# Patient Record
Sex: Male | Born: 1965 | Race: Black or African American | Hispanic: No | Marital: Single | Smoking: Current every day smoker
Health system: Southern US, Community
[De-identification: ages and names within clinical notes are randomized; demographics above are authoritative.]

## PROBLEM LIST (undated history)

## (undated) DIAGNOSIS — R7303 Prediabetes: Secondary | ICD-10-CM

## (undated) DIAGNOSIS — E782 Mixed hyperlipidemia: Secondary | ICD-10-CM

## (undated) DIAGNOSIS — K219 Gastro-esophageal reflux disease without esophagitis: Secondary | ICD-10-CM

## (undated) HISTORY — DX: Prediabetes: R73.03

---

## 2007-01-02 ENCOUNTER — Emergency Department (HOSPITAL_COMMUNITY): Admission: EM | Admit: 2007-01-02 | Discharge: 2007-01-02 | Payer: Self-pay | Admitting: Emergency Medicine

## 2010-07-29 ENCOUNTER — Emergency Department (HOSPITAL_COMMUNITY)
Admission: EM | Admit: 2010-07-29 | Discharge: 2010-07-29 | Disposition: A | Discharge status: Home / Self Care | Payer: Self-pay | Attending: Emergency Medicine | Admitting: Emergency Medicine

## 2010-07-29 DIAGNOSIS — T63461A Toxic effect of venom of wasps, accidental (unintentional), initial encounter: Secondary | ICD-10-CM | POA: Insufficient documentation

## 2010-07-29 DIAGNOSIS — M7989 Other specified soft tissue disorders: Secondary | ICD-10-CM | POA: Insufficient documentation

## 2010-07-29 DIAGNOSIS — T6391XA Toxic effect of contact with unspecified venomous animal, accidental (unintentional), initial encounter: Secondary | ICD-10-CM | POA: Insufficient documentation

## 2010-07-29 DIAGNOSIS — L989 Disorder of the skin and subcutaneous tissue, unspecified: Secondary | ICD-10-CM | POA: Insufficient documentation

## 2010-11-28 LAB — CBC
HCT: 40.1
Hemoglobin: 14.2
MCV: 95.4
Platelets: 402 — ABNORMAL HIGH
RDW: 13.8

## 2010-11-28 LAB — DIFFERENTIAL
Basophils Absolute: 0.2 — ABNORMAL HIGH
Basophils Relative: 2 — ABNORMAL HIGH
Eosinophils Absolute: 0.3

## 2010-11-28 LAB — BASIC METABOLIC PANEL
CO2: 24
Calcium: 9.4
Chloride: 105
Creatinine, Ser: 0.78
GFR calc Af Amer: >60
Sodium: 136

## 2012-09-24 ENCOUNTER — Emergency Department (HOSPITAL_COMMUNITY)
Admission: EM | Admit: 2012-09-24 | Discharge: 2012-09-24 | Disposition: A | Discharge status: Home / Self Care | Payer: Self-pay | Attending: Emergency Medicine | Admitting: Emergency Medicine

## 2012-09-24 ENCOUNTER — Encounter (HOSPITAL_COMMUNITY): Payer: Self-pay | Admitting: Emergency Medicine

## 2012-09-24 DIAGNOSIS — F17200 Nicotine dependence, unspecified, uncomplicated: Secondary | ICD-10-CM | POA: Insufficient documentation

## 2012-09-24 DIAGNOSIS — Y929 Unspecified place or not applicable: Secondary | ICD-10-CM | POA: Insufficient documentation

## 2012-09-24 DIAGNOSIS — T1590XA Foreign body on external eye, part unspecified, unspecified eye, initial encounter: Secondary | ICD-10-CM | POA: Insufficient documentation

## 2012-09-24 DIAGNOSIS — Y9389 Activity, other specified: Secondary | ICD-10-CM | POA: Insufficient documentation

## 2012-09-24 DIAGNOSIS — S058X9A Other injuries of unspecified eye and orbit, initial encounter: Secondary | ICD-10-CM | POA: Insufficient documentation

## 2012-09-24 DIAGNOSIS — S0502XA Injury of conjunctiva and corneal abrasion without foreign body, left eye, initial encounter: Secondary | ICD-10-CM

## 2012-09-24 MED ORDER — FLUORESCEIN SODIUM 1 MG OP STRP
1.0000 | ORAL_STRIP | Freq: Once | OPHTHALMIC | Status: DC
  Filled 2012-09-24: qty 1

## 2012-09-24 MED ORDER — ERYTHROMYCIN 5 MG/GM OP OINT: TOPICAL_OINTMENT | OPHTHALMIC | Status: DC

## 2012-09-24 MED ORDER — TETRACAINE HCL 0.5 % OP SOLN
2.0000 [drp] | Freq: Once | OPHTHALMIC | Status: DC
  Filled 2012-09-24: qty 2

## 2012-09-24 NOTE — ED Notes (Signed)
Pt reports left sided eye pain that started yesterday around 1500. Pt reports cutting wood with a skill saw and it is possible some dust flew in his eye. Pt reports washing out his eye with a over the counter eye wash solution. Eye visualized in triage. No obvious object identified about inspection, however the sclera is red and irritated. Pt is A/O x4 and in no apparent distress.

## 2012-09-24 NOTE — ED Provider Notes (Signed)
CSN: [id number]     Arrival date & time 09/24/12  1522 History    This chart was scribed for Raymon Mutton, PA working with Flint Melter, MD by Quintella Reichert, ED Scribe. This patient was seen in room WTR5/WTR5 and the patient's care was started at 7:05 PM.     Chief Complaint  Patient presents with  . Eye Pain    The history is provided by the patient. No language interpreter was used.    HPI Comments: Ryan Owen is a 47 y.o. male who presents to the Emergency Department complaining of constant moderate left eye pain that began yesterday afternoon, with associated redness, clear discharge, photophobia, and a small amount of visible swelling to the eyelids.  Pt reports he was cutting wood with a saw and sawdust may have entered the eye.  He was not wearing eye protection.  He notes that he cleaned his eye out with water after the incident.   Pain is described as "irritating" with a foreign body sensation.  It is aggravated when sweat runs into the eye and is not relieved by anything.  It is not aggravated by eye movements.  It does not radiate to any other areas.  He has attempted to treat pain with an OTC eye wash solution, without relief.  He denies loss of vision, fever, chills, CP, SOB, difficulty swallowing, sore throat, facial pain, ear pain, headache, dizziness or any other associated symptoms.  He denies h/o eye problems.  Last tetanus vaccination was 1.5 years ago.   Pt has no PCP.   History reviewed. No pertinent past medical history.   History reviewed. No pertinent past surgical history.   No family history on file.   History  Substance Use Topics  . Smoking status: Current Every Day Smoker -- 0.50 packs/day for 20 years    Types: Cigarettes  . Smokeless tobacco: Never Used  . Alcohol Use: 10.8 oz/week    18 Cans of beer per week     Review of Systems  Constitutional: Negative for fever and chills.  HENT: Negative for ear pain, sore throat and trouble  swallowing.   Eyes: Positive for photophobia, pain, discharge and redness. Negative for itching and visual disturbance.       Swelling to eyelids, foreign body sensation.  Respiratory: Negative for shortness of breath.   Cardiovascular: Negative for chest pain.  Neurological: Negative for dizziness and headaches.  All other systems reviewed and are negative.     Allergies  Review of patient's allergies indicates no known allergies.  Home Medications   Current Outpatient Rx  Name  Route  Sig  Dispense  Refill  . erythromycin ophthalmic ointment      Place a 1/2 inch ribbon of ointment into the lower eyelid of the left eye 4 times a day for 5 days   3.5 g   0     BP 135/92  Pulse 84  Temp(Src) 97.9 F (36.6 C) (Oral)  Resp 16  SpO2 100%  Physical Exam  Nursing note and vitals reviewed. Constitutional: He is oriented to person, place, and time. He appears well-developed and well-nourished. No distress.  HENT:  Head: Normocephalic and atraumatic.  Eyes: EOM and lids are normal. Pupils are equal, round, and reactive to light. No foreign bodies found. Right eye exhibits no discharge. Left eye exhibits no discharge. Left conjunctiva is injected. Left conjunctiva has no hemorrhage. Left eye exhibits normal extraocular motion and no nystagmus. Left pupil is round  and reactive.  Fundoscopic exam:      The right eye shows no arteriolar narrowing, no AV nicking, no exudate, no hemorrhage and no papilledema. The right eye shows red reflex and venous pulsations.       The left eye shows no arteriolar narrowing, no AV nicking, no exudate, no hemorrhage and no papilledema. The left eye shows red reflex and venous pulsations.  Slit lamp exam:      The left eye shows corneal abrasion and fluorescein uptake. The left eye shows no foreign body, no hyphema and no anterior chamber bulge.    Mild swelling noted to the lower lid of the left eye - negative erythema, inflammation Tearing  positive to the left eye - clear  Negative discharge noted Injection to the left sclera noted  Neck: Normal range of motion. Neck supple.  Cardiovascular: Normal rate, regular rhythm and normal heart sounds.   No murmur heard. Pulses:      Radial pulses are 2+ on the right side, and 2+ on the left side.  Pulmonary/Chest: Effort normal and breath sounds normal. No respiratory distress. He has no wheezes. He has no rales.  Musculoskeletal: Normal range of motion.  Neurological: He is alert and oriented to person, place, and time. No cranial nerve deficit.  Cranial nerves III-XII grossly intact   Skin: Skin is warm and dry. No erythema.  Psychiatric: He has a normal mood and affect. His behavior is normal.    ED Course  Procedures (including critical care time)  DIAGNOSTIC STUDIES: Oxygen Saturation is 100% on room air, normal by my interpretation.    COORDINATION OF CARE: 7:12 PM-Discussed treatment plan which includesslit-lamp exam with pt at bedside and pt agreed to plan.    SLIT LAMP EXAM: Tetracaine 2 drop used.  Lids everted and swept for exam, no evidence of foreign body.  Conjunctivae: Some injection Cornea: Abrasions noted. EOM: Intact  Pupils: PERRL  Fluorescein uptake: Yes  Negative dendritic lesion. Patient tolerated procedure well without immediate complications.   7:25 PM: Informed pt that examination revealed small corneal abrasions.  Discussed treatment plan which includes erythromycin eye drops and f/u with ophthalmologist tomorrow with pt at bedside and pt agreed to plan.    Labs Reviewed - No data to display  No results found.  1. Corneal abrasion, left, initial encounter      MDM  I personally performed the services described in this documentation, which was scribed in my presence. The recorded information has been reviewed and is accurate.  Patient presenting to the ED with left eye pain that started yesterday after sawing and saw dust got into  patient's eye - denied wearing glasses. Presenting to the ED with mild swelling to the left eye with injection and positive clear tearing. Reported that the left eye is irritated, has a foreign body sensation. Denied trauma, injury, loss of vision, pus drainage, fever, chills, pain with motion. Mild swelling noted to the lower lid of the left eye - minimal - negative erythema, inflammation. Injection to the left sclera noted. Lid everted with negative foreign body. Negative fundoscopic findings. Slit lamp and positive fluorescein uptake - corneal abrasion noted. Negative dendritic lesion. Negative Seidel's sign. Visual acuity negative findings - left 20/25, right 20/25.  Negative signs of pre-septal and post-septal cellulitis. Negative signs of herpes simplex keratitis. Corneal abrasion noted. Patient stable, afebrile. Up to date on tetanus. Discharged patient with antibiotics. Referred patient to ophthalmology. Recommended patient to wear sunglasses and protective eye  gear. Discussed with patient to continue to monitor symptoms and if symptoms are to worsen or change to report back to the ED - strict return instructions given.  Patient agreed to plan of care, understood, all questions answered.        Raymon Mutton, PA-C 09/25/12 1904  Raymon Mutton, PA-C 09/25/12 1904

## 2012-09-26 NOTE — ED Provider Notes (Signed)
Medical screening examination/treatment/procedure(s) were performed by non-physician practitioner and as supervising physician I was immediately available for consultation/collaboration.  Candy Ziegler L Thaddeaus Monica, MD 09/26/12 1550 

## 2016-11-30 ENCOUNTER — Ambulatory Visit: Payer: Self-pay | Admitting: Physician Assistant

## 2017-10-24 ENCOUNTER — Encounter (HOSPITAL_COMMUNITY): Payer: Self-pay | Admitting: Obstetrics and Gynecology

## 2017-10-24 ENCOUNTER — Other Ambulatory Visit: Payer: Self-pay

## 2017-10-24 ENCOUNTER — Emergency Department (HOSPITAL_COMMUNITY)
Admission: EM | Admit: 2017-10-24 | Discharge: 2017-10-24 | Disposition: A | Discharge status: Home / Self Care | Payer: BLUE CROSS/BLUE SHIELD | Attending: Emergency Medicine | Admitting: Emergency Medicine

## 2017-10-24 DIAGNOSIS — K219 Gastro-esophageal reflux disease without esophagitis: Secondary | ICD-10-CM | POA: Diagnosis not present

## 2017-10-24 DIAGNOSIS — F17210 Nicotine dependence, cigarettes, uncomplicated: Secondary | ICD-10-CM | POA: Diagnosis not present

## 2017-10-24 DIAGNOSIS — Z79899 Other long term (current) drug therapy: Secondary | ICD-10-CM | POA: Diagnosis not present

## 2017-10-24 DIAGNOSIS — R1010 Upper abdominal pain, unspecified: Secondary | ICD-10-CM | POA: Diagnosis present

## 2017-10-24 LAB — URINALYSIS, ROUTINE W REFLEX MICROSCOPIC
Bacteria, UA: NONE SEEN
Bilirubin Urine: NEGATIVE
GLUCOSE, UA: NEGATIVE mg/dL
Hgb urine dipstick: NEGATIVE
Ketones, ur: NEGATIVE mg/dL
Nitrite: NEGATIVE
PH: 5 (ref 5.0–8.0)
PROTEIN: NEGATIVE mg/dL
Specific Gravity, Urine: 1.013 (ref 1.005–1.030)

## 2017-10-24 LAB — COMPREHENSIVE METABOLIC PANEL
ALT: 15 U/L (ref 0–44)
ANION GAP: 8 (ref 5–15)
AST: 18 U/L (ref 15–41)
Albumin: 4.2 g/dL (ref 3.5–5.0)
Alkaline Phosphatase: 72 U/L (ref 38–126)
BILIRUBIN TOTAL: 0.5 mg/dL (ref 0.3–1.2)
BUN: 12 mg/dL (ref 6–20)
CALCIUM: 9 mg/dL (ref 8.9–10.3)
CO2: 23 mmol/L (ref 22–32)
Chloride: 106 mmol/L (ref 98–111)
Creatinine, Ser: 0.75 mg/dL (ref 0.61–1.24)
GFR calc Af Amer: >60 mL/min (ref >60)
Glucose, Bld: 86 mg/dL (ref 70–99)
POTASSIUM: 3.6 mmol/L (ref 3.5–5.1)
Sodium: 137 mmol/L (ref 135–145)
TOTAL PROTEIN: 7.6 g/dL (ref 6.5–8.1)

## 2017-10-24 LAB — CBC
HEMATOCRIT: 38.5 % — AB (ref 39.0–52.0)
Hemoglobin: 13.1 g/dL (ref 13.0–17.0)
MCH: 32.1 pg (ref 26.0–34.0)
MCHC: 34 g/dL (ref 30.0–36.0)
MCV: 94.4 fL (ref 78.0–100.0)
Platelets: 360 10*3/uL (ref 150–400)
RBC: 4.08 MIL/uL — ABNORMAL LOW (ref 4.22–5.81)
RDW: 14.6 % (ref 11.5–15.5)
WBC: 9.5 10*3/uL (ref 4.0–10.5)

## 2017-10-24 LAB — LIPASE, BLOOD: Lipase: 38 U/L (ref 11–51)

## 2017-10-24 MED ORDER — PANTOPRAZOLE SODIUM 40 MG PO TBEC
40.0000 mg | DELAYED_RELEASE_TABLET | Freq: Every day | ORAL | Status: DC
  Administered 2017-10-24: 40 mg via ORAL
  Filled 2017-10-24: qty 1

## 2017-10-24 MED ORDER — OMEPRAZOLE 20 MG PO CPDR: 20.0000 mg | DELAYED_RELEASE_CAPSULE | Freq: Every day | ORAL | 1 refills | Status: DC

## 2017-10-24 NOTE — ED Triage Notes (Signed)
Per Pt: Pt reports he has been having upper abdominal pain x2 weeks. Pt reports he was doing some antacids and that had been helping but they are no longer helping.  Pt reports last BM this morning at 5am.  Pt reports he had a beer this morning.

## 2017-10-24 NOTE — Discharge Instructions (Signed)
Take the medications as scribed.  You can also take over-the-counter Mylanta or Tums.  Follow-up with her primary care doctor.  Return as needed for worsening symptoms

## 2017-10-24 NOTE — ED Provider Notes (Signed)
[facility]-EMERGENCY DEPT Provider Note   CSN: [id number] Arrival date & time: 10/24/17  1309     History   Chief Complaint Chief Complaint  Patient presents with  . Abdominal Pain    HPI Ryan Owen is a 52 y.o. male.  HPI Patient presents to the emergency room for evaluation of upper abdominal discomfort.  Patient states he has had some gas-like feeling in his upper abdomen for the last couple of weeks.  Then started to become burning in nature.  Patient tried taking some antacids and it might of helped temporarily but his symptoms return.  Patient thinks that sometimes sugary substances make it worse but otherwise he has not noticed anything in particular.  He avoids spicy foods.  He does drink alcohol but has not noticed any issues with that exacerbating his symptoms.  He denies any trouble with chest pain or shortness of breath.  No vomiting or diarrhea.  No blood in his stool.  No dysuria. History reviewed. No pertinent past medical history.  There are no active problems to display for this patient.   History reviewed. No pertinent surgical history.      Home Medications    Prior to Admission medications   Medication Sig Start Date End Date Taking? Authorizing Provider  naproxen sodium (ALEVE) 220 MG tablet Take 440 mg by mouth 2 (two) times daily as needed (back pain).   Yes [provider]  ranitidine (ZANTAC) 150 MG tablet Take 150 mg by mouth 2 (two) times daily.   Yes [provider]  erythromycin ophthalmic ointment Place a 1/2 inch ribbon of ointment into the lower eyelid of the left eye 4 times a day for 5 days Patient not taking: Reported on 10/24/2017 09/24/12   Sciacca, Ashok Cordia, PA-C  omeprazole (PRILOSEC) 20 MG capsule Take 1 capsule (20 mg total) by mouth daily for 14 days. 10/24/17 11/07/17  Linwood Dibbles, MD    Family History No family history on file.  Social History Social History   Tobacco Use  . Smoking status:  Current Every Day Smoker    Packs/day: 0.50    Years: 20.00    Pack years: 10.00    Types: Cigarettes  . Smokeless tobacco: Never Used  Substance Use Topics  . Alcohol use: Yes    Alcohol/week: 18.0 standard drinks    Types: 18 Cans of beer per week  . Drug use: Yes    Frequency: 0.2 times per week    Types: Marijuana     Allergies   Penicillins   Review of Systems Review of Systems  All other systems reviewed and are negative.    Physical Exam Updated Vital Signs BP (!) 141/107 (BP Location: Right Arm)   Pulse 84   Temp 98.3 F (36.8 C)   Resp 16   Ht 1.829 m (6')   Wt 74.8 kg   SpO2 100%   BMI 22.38 kg/m   Physical Exam  Constitutional: He appears well-developed and well-nourished. No distress.  HENT:  Head: Normocephalic and atraumatic.  Right Ear: External ear normal.  Left Ear: External ear normal.  Eyes: Conjunctivae are normal. Right eye exhibits no discharge. Left eye exhibits no discharge. No scleral icterus.  Neck: Neck supple. No tracheal deviation present.  Cardiovascular: Normal rate, regular rhythm and intact distal pulses.  Pulmonary/Chest: Effort normal and breath sounds normal. No stridor. No respiratory distress. He has no wheezes. He has no rales.  Abdominal: Soft. Bowel sounds are normal.  He exhibits no distension. There is no tenderness. There is no rebound and no guarding.  Musculoskeletal: He exhibits no edema or tenderness.  Neurological: He is alert. He has normal strength. No cranial nerve deficit (no facial droop, extraocular movements intact, no slurred speech) or sensory deficit. He exhibits normal muscle tone. He displays no seizure activity. Coordination normal.  Skin: Skin is warm and dry. No rash noted.  Psychiatric: He has a normal mood and affect.  Nursing note and vitals reviewed.    ED Treatments / Results  Labs (all labs ordered are listed, but only abnormal results are displayed) Labs Reviewed  CBC - Abnormal;  Notable for the following components:      Result Value   RBC 4.08 (*)    HCT 38.5 (*)    All other components within normal limits  URINALYSIS, ROUTINE W REFLEX MICROSCOPIC - Abnormal; Notable for the following components:   Color, Urine STRAW (*)    Leukocytes, UA MODERATE (*)    All other components within normal limits  LIPASE, BLOOD  COMPREHENSIVE METABOLIC PANEL    EKG None  Radiology No results found.  Procedures Procedures (including critical care time)  Medications Ordered in ED Medications  pantoprazole (PROTONIX) EC tablet 40 mg (has no administration in time range)     Initial Impression / Assessment and Plan / ED Course  I have reviewed the triage vital signs and the nursing notes.  Pertinent labs & imaging results that were available during my care of the patient were reviewed by me and considered in my medical decision making (see chart for details).   Patient presented to the emergency with complaints of upper abdominal discomfort.  Patient states he has been having burping and belching.  Symptoms are suggestive of gastroesophageal reflux.  Patient's laboratory tests were reassuring.  He has normal LFTs and lipase.  Patient did have 11-20 white blood cells in the urine and moderate leukocyte esterase but he denies any dysuria or any urinary frequency.  I will send off a urine culture but not start any empiric treatment.  Plan on discharge home with oral antacids.  Discussed reducing alcohol intake and spicy foods.  Follow-up with primary care doctor.  Final Clinical Impressions(s) / ED Diagnoses   Final diagnoses:  Gastroesophageal reflux disease, esophagitis presence not specified    ED Discharge Orders         Ordered    omeprazole (PRILOSEC) 20 MG capsule  Daily     10/24/17 1917           Linwood Dibbles, MD 10/24/17 1918

## 2017-10-24 NOTE — ED Notes (Signed)
Patient stated that he can not give sample at this time and will cal out when ready.

## 2017-10-26 LAB — URINE CULTURE

## 2017-10-27 ENCOUNTER — Telehealth: Payer: Self-pay

## 2017-10-27 NOTE — Telephone Encounter (Signed)
Post ED Visit - Positive Culture Follow-up  Culture report reviewed by antimicrobial stewardship pharmacist:  []  Enzo Bi, Pharm.D. []  Celedonio Miyamoto, Pharm.D., BCPS AQ-ID []  Garvin Fila, Pharm.D., BCPS []  Georgina Pillion, Pharm.D., BCPS []  [place], [place].D., BCPS, AAHIVP []  Estella Husk, Pharm.D., BCPS, AAHIVP []  Lysle Pearl, PharmD, BCPS []  Phillips Climes, PharmD, BCPS [x]  Agapito Games, PharmD, BCPS []  Verlan Friends, PharmD  Positive urine culture  and no further patient follow-up is required at this time.  Jerry Caras 10/27/2017, 4:00 PM

## 2019-07-03 ENCOUNTER — Encounter (HOSPITAL_COMMUNITY): Payer: Self-pay | Admitting: Emergency Medicine

## 2019-07-03 ENCOUNTER — Emergency Department (HOSPITAL_COMMUNITY): Payer: Self-pay

## 2019-07-03 ENCOUNTER — Other Ambulatory Visit: Payer: Self-pay

## 2019-07-03 ENCOUNTER — Inpatient Hospital Stay (HOSPITAL_COMMUNITY)
Admission: EM | Admit: 2019-07-03 | Discharge: 2019-07-07 | DRG: 281 | Disposition: A | Discharge status: Home / Self Care | Payer: Self-pay | Attending: Family Medicine | Admitting: Family Medicine

## 2019-07-03 DIAGNOSIS — Z880 Allergy status to penicillin: Secondary | ICD-10-CM

## 2019-07-03 DIAGNOSIS — I214 Non-ST elevation (NSTEMI) myocardial infarction: Principal | ICD-10-CM | POA: Diagnosis present

## 2019-07-03 DIAGNOSIS — Z79899 Other long term (current) drug therapy: Secondary | ICD-10-CM

## 2019-07-03 DIAGNOSIS — F1210 Cannabis abuse, uncomplicated: Secondary | ICD-10-CM | POA: Diagnosis present

## 2019-07-03 DIAGNOSIS — R7303 Prediabetes: Secondary | ICD-10-CM | POA: Diagnosis present

## 2019-07-03 DIAGNOSIS — I10 Essential (primary) hypertension: Secondary | ICD-10-CM | POA: Diagnosis present

## 2019-07-03 DIAGNOSIS — R079 Chest pain, unspecified: Secondary | ICD-10-CM

## 2019-07-03 DIAGNOSIS — I472 Ventricular tachycardia: Secondary | ICD-10-CM | POA: Diagnosis not present

## 2019-07-03 DIAGNOSIS — E041 Nontoxic single thyroid nodule: Secondary | ICD-10-CM

## 2019-07-03 DIAGNOSIS — F17210 Nicotine dependence, cigarettes, uncomplicated: Secondary | ICD-10-CM | POA: Diagnosis present

## 2019-07-03 DIAGNOSIS — Z20822 Contact with and (suspected) exposure to covid-19: Secondary | ICD-10-CM | POA: Diagnosis present

## 2019-07-03 DIAGNOSIS — I25111 Atherosclerotic heart disease of native coronary artery with angina pectoris with documented spasm: Secondary | ICD-10-CM | POA: Diagnosis present

## 2019-07-03 DIAGNOSIS — K219 Gastro-esophageal reflux disease without esophagitis: Secondary | ICD-10-CM | POA: Diagnosis present

## 2019-07-03 DIAGNOSIS — E042 Nontoxic multinodular goiter: Secondary | ICD-10-CM | POA: Diagnosis present

## 2019-07-03 DIAGNOSIS — F1410 Cocaine abuse, uncomplicated: Secondary | ICD-10-CM | POA: Diagnosis present

## 2019-07-03 DIAGNOSIS — F1010 Alcohol abuse, uncomplicated: Secondary | ICD-10-CM | POA: Diagnosis present

## 2019-07-03 DIAGNOSIS — E782 Mixed hyperlipidemia: Secondary | ICD-10-CM | POA: Diagnosis present

## 2019-07-03 DIAGNOSIS — E876 Hypokalemia: Secondary | ICD-10-CM | POA: Diagnosis present

## 2019-07-03 DIAGNOSIS — I700 Atherosclerosis of aorta: Secondary | ICD-10-CM | POA: Diagnosis present

## 2019-07-03 DIAGNOSIS — Z8249 Family history of ischemic heart disease and other diseases of the circulatory system: Secondary | ICD-10-CM

## 2019-07-03 HISTORY — DX: Mixed hyperlipidemia: E78.2

## 2019-07-03 HISTORY — DX: Gastro-esophageal reflux disease without esophagitis: K21.9

## 2019-07-03 LAB — RAPID URINE DRUG SCREEN, HOSP PERFORMED
Amphetamines: NOT DETECTED
Barbiturates: NOT DETECTED
Benzodiazepines: NOT DETECTED
Cocaine: POSITIVE — AB
Opiates: NOT DETECTED
Tetrahydrocannabinol: POSITIVE — AB

## 2019-07-03 LAB — LIPID PANEL
Cholesterol: 191 mg/dL (ref 0–200)
HDL: 52 mg/dL (ref >40)
LDL Cholesterol: 84 mg/dL (ref 0–99)
Total CHOL/HDL Ratio: 3.7 RATIO
Triglycerides: 276 mg/dL — ABNORMAL HIGH (ref <150)
VLDL: 55 mg/dL — ABNORMAL HIGH (ref 0–40)

## 2019-07-03 LAB — TROPONIN I (HIGH SENSITIVITY)
Troponin I (High Sensitivity): 432 ng/L (ref <18)
Troponin I (High Sensitivity): 765 ng/L (ref <18)
Troponin I (High Sensitivity): 2143 ng/L (ref <18)
Troponin I (High Sensitivity): 4193 ng/L (ref <18)
Troponin I (High Sensitivity): 5873 ng/L (ref <18)

## 2019-07-03 LAB — HEPARIN LEVEL (UNFRACTIONATED)
Heparin Unfractionated: 0.38 IU/mL (ref 0.30–0.70)
Heparin Unfractionated: 0.18 IU/mL — ABNORMAL LOW (ref 0.30–0.70)

## 2019-07-03 LAB — BASIC METABOLIC PANEL
Anion gap: 9 (ref 5–15)
BUN: 7 mg/dL (ref 6–20)
CO2: 23 mmol/L (ref 22–32)
Calcium: 8.4 mg/dL — ABNORMAL LOW (ref 8.9–10.3)
Chloride: 107 mmol/L (ref 98–111)
Creatinine, Ser: 0.79 mg/dL (ref 0.61–1.24)
GFR calc Af Amer: >60 mL/min (ref >60)
GFR calc non Af Amer: >60 mL/min (ref >60)
Glucose, Bld: 91 mg/dL (ref 70–99)
Potassium: 3.3 mmol/L — ABNORMAL LOW (ref 3.5–5.1)
Sodium: 139 mmol/L (ref 135–145)

## 2019-07-03 LAB — ECHOCARDIOGRAM COMPLETE
Height: 72 in
Weight: 2640 oz

## 2019-07-03 LAB — PHOSPHORUS: Phosphorus: 3.8 mg/dL (ref 2.5–4.6)

## 2019-07-03 LAB — APTT: aPTT: 25 seconds (ref 24–36)

## 2019-07-03 LAB — PROTIME-INR
INR: 1.1 (ref 0.8–1.2)
Prothrombin Time: 13.6 seconds (ref 11.4–15.2)

## 2019-07-03 LAB — CBC
HCT: 35.2 % — ABNORMAL LOW (ref 39.0–52.0)
Hemoglobin: 11.9 g/dL — ABNORMAL LOW (ref 13.0–17.0)
MCH: 33.6 pg (ref 26.0–34.0)
MCHC: 33.8 g/dL (ref 30.0–36.0)
MCV: 99.4 fL (ref 80.0–100.0)
Platelets: 331 10*3/uL (ref 150–400)
RBC: 3.54 MIL/uL — ABNORMAL LOW (ref 4.22–5.81)
RDW: 15.1 % (ref 11.5–15.5)
WBC: 9.2 10*3/uL (ref 4.0–10.5)
nRBC: 0 % (ref 0.0–0.2)

## 2019-07-03 LAB — MRSA PCR SCREENING: MRSA by PCR: NEGATIVE

## 2019-07-03 LAB — SARS CORONAVIRUS 2 BY RT PCR (HOSPITAL ORDER, PERFORMED IN ~~LOC~~ HOSPITAL LAB): SARS Coronavirus 2: NEGATIVE

## 2019-07-03 LAB — MAGNESIUM: Magnesium: 1.9 mg/dL (ref 1.7–2.4)

## 2019-07-03 LAB — HEMOGLOBIN A1C
Hgb A1c MFr Bld: 5.7 % — ABNORMAL HIGH (ref 4.8–5.6)
Mean Plasma Glucose: 116.89 mg/dL

## 2019-07-03 MED ORDER — ONDANSETRON HCL 4 MG/2ML IJ SOLN: 4.0000 mg | Freq: Four times a day (QID) | INTRAMUSCULAR | Status: DC | PRN

## 2019-07-03 MED ORDER — ASPIRIN EC 325 MG PO TBEC
325.0000 mg | DELAYED_RELEASE_TABLET | Freq: Once | ORAL | Status: AC
Start: 2019-07-03 — End: 2019-07-03
  Administered 2019-07-03: 325 mg via ORAL
  Filled 2019-07-03: qty 1

## 2019-07-03 MED ORDER — HEPARIN (PORCINE) 25000 UT/250ML-% IV SOLN
1300.0000 [IU]/h | INTRAVENOUS | Status: DC
  Administered 2019-07-04: 1300 [IU]/h via INTRAVENOUS
  Filled 2019-07-03: qty 250

## 2019-07-03 MED ORDER — LORAZEPAM 1 MG PO TABS: 1.0000 mg | ORAL_TABLET | ORAL | Status: DC | PRN

## 2019-07-03 MED ORDER — LORAZEPAM 2 MG/ML IJ SOLN: 1.0000 mg | INTRAMUSCULAR | Status: DC | PRN

## 2019-07-03 MED ORDER — HEPARIN (PORCINE) 25000 UT/250ML-% IV SOLN
1050.0000 [IU]/h | INTRAVENOUS | Status: DC
  Administered 2019-07-03: 1050 [IU]/h via INTRAVENOUS
  Filled 2019-07-03: qty 250

## 2019-07-03 MED ORDER — ADULT MULTIVITAMIN W/MINERALS CH
1.0000 | ORAL_TABLET | Freq: Every day | ORAL | Status: DC
  Administered 2019-07-03 – 2019-07-06 (×4): 1 via ORAL
  Filled 2019-07-03 (×4): qty 1

## 2019-07-03 MED ORDER — LORAZEPAM 2 MG/ML IJ SOLN
1.0000 mg | Freq: Once | INTRAMUSCULAR | Status: AC
  Administered 2019-07-03: 1 mg via INTRAVENOUS
  Filled 2019-07-03: qty 1

## 2019-07-03 MED ORDER — THIAMINE HCL 100 MG/ML IJ SOLN: 100.0000 mg | Freq: Every day | INTRAMUSCULAR | Status: DC

## 2019-07-03 MED ORDER — ACETAMINOPHEN 325 MG PO TABS: 650.0000 mg | ORAL_TABLET | ORAL | Status: DC | PRN

## 2019-07-03 MED ORDER — ORAL CARE MOUTH RINSE
15.0000 mL | Freq: Two times a day (BID) | OROMUCOSAL | Status: DC
  Administered 2019-07-03 – 2019-07-06 (×4): 15 mL via OROMUCOSAL

## 2019-07-03 MED ORDER — SODIUM CHLORIDE (PF) 0.9 % IJ SOLN
INTRAMUSCULAR | Status: AC
  Filled 2019-07-03: qty 50

## 2019-07-03 MED ORDER — HEPARIN BOLUS VIA INFUSION
4000.0000 [IU] | Freq: Once | INTRAVENOUS | Status: AC
  Administered 2019-07-03: 4000 [IU] via INTRAVENOUS
  Filled 2019-07-03: qty 4000

## 2019-07-03 MED ORDER — THIAMINE HCL 100 MG PO TABS
100.0000 mg | ORAL_TABLET | Freq: Every day | ORAL | Status: DC
  Administered 2019-07-03 – 2019-07-07 (×5): 100 mg via ORAL
  Filled 2019-07-03 (×5): qty 1

## 2019-07-03 MED ORDER — IOHEXOL 350 MG/ML SOLN
100.0000 mL | Freq: Once | INTRAVENOUS | Status: AC | PRN
  Administered 2019-07-03: 100 mL via INTRAVENOUS

## 2019-07-03 MED ORDER — ASPIRIN EC 81 MG PO TBEC
81.0000 mg | DELAYED_RELEASE_TABLET | Freq: Every day | ORAL | Status: DC
  Administered 2019-07-04 – 2019-07-07 (×3): 81 mg via ORAL
  Filled 2019-07-03 (×4): qty 1

## 2019-07-03 MED ORDER — SODIUM CHLORIDE 0.9% FLUSH
3.0000 mL | Freq: Once | INTRAVENOUS | Status: AC
  Administered 2019-07-03: 3 mL via INTRAVENOUS

## 2019-07-03 MED ORDER — HEPARIN BOLUS VIA INFUSION
2000.0000 [IU] | Freq: Once | INTRAVENOUS | Status: AC
  Administered 2019-07-03: 2000 [IU] via INTRAVENOUS
  Filled 2019-07-03: qty 2000

## 2019-07-03 MED ORDER — FOLIC ACID 1 MG PO TABS
1.0000 mg | ORAL_TABLET | Freq: Every day | ORAL | Status: DC
  Administered 2019-07-03 – 2019-07-06 (×4): 1 mg via ORAL
  Filled 2019-07-03 (×4): qty 1

## 2019-07-03 MED ORDER — NITROGLYCERIN 0.4 MG SL SUBL
0.4000 mg | SUBLINGUAL_TABLET | SUBLINGUAL | Status: DC | PRN
  Administered 2019-07-03 (×3): 0.4 mg via SUBLINGUAL
  Filled 2019-07-03: qty 1

## 2019-07-03 MED ORDER — ATORVASTATIN CALCIUM 10 MG PO TABS
20.0000 mg | ORAL_TABLET | Freq: Every day | ORAL | Status: DC
  Administered 2019-07-03 – 2019-07-06 (×4): 20 mg via ORAL
  Filled 2019-07-03 (×4): qty 2

## 2019-07-03 MED ORDER — CHLORHEXIDINE GLUCONATE CLOTH 2 % EX PADS
6.0000 | MEDICATED_PAD | Freq: Every day | CUTANEOUS | Status: DC
  Administered 2019-07-03 – 2019-07-06 (×4): 6 via TOPICAL

## 2019-07-03 NOTE — ED Notes (Signed)
Date and time results received: 07/03/19 1230  (use smartphrase ".now" to insert current time)  Test: Troponin 2143 Critical Value: 2143  Name of Provider Notified: Dr. Dayna Barker  Orders Received? Or Actions Taken?:

## 2019-07-03 NOTE — Consult Note (Addendum)
Cardiology Consultation:   Patient ID: Linell Shawn; [id number]; 06-Nov-1965   Admit date: 07/03/2019 Date of Consult: 07/03/2019  Primary Care Provider: Patient, No Pcp Per Primary Cardiologist: New to [facility] Primary Electrophysiologist:  None   Patient Profile:   Jeris Roser is a 54 y.o. male with a a PMH of GERD, cocaine abuse, and tobacco abuse who is being seen today for the evaluation of chest pain and elevated troponin at the request of Dr. Freida Busman.  History of Present Illness:   Mr. Pulis was in his usual state of health until yesterday evening when he began experiencing substernal chest pain. He reported drinking alcohol and using cocaine prior to onset of symptoms. Pain is sharp, non-radiating, and worse with deep breathing. He took tums/ H2 blocker at home without relief of symptoms, prompting him to present to the ED for further evaluation.   He has no prior cardiac history and has never had an ischemic evaluation in the past. He uses cocaine ~ 1 time per week. He smokes 1/2 ppd. He drinks 2 40oz beers a day and denies withdrawal symptoms in the past when stopping. He denies family history of CAD or CHF.   He reports significant improvement in chest pain since receiving SL nitro in the ED. Still mildly present. He describes the pain as sharp and dull at the same time. Non-radiating. Some associated SOB, lightheadedness, and nausea without vomiting. No diaphoresis, palpitations, or syncope. He has never had chest pain before. He works a strenuous job Market researcher and denies chest pain or SOB with these activities. No complaints of LE edema, orthopnea, or PND. He reports BP has been elevated in the past and he has been told he has high cholesterol but is not on medications for either. No history of DM.   ED course: BP generally stable with intermittently elevated DPB to the 90s, intermittently tachypneic, otherwise VSS. Labs notable for K 3.3, Cr 0.79, Hgb  11.9 (down from 13.1 in 2019), PLT 331, HsTrop 432>765. EKG with sinus rhythm with rate 73 bpm, early repol, isolated biphasic T wave in III, no STE/D, no comparison and no significant change on serial EKG. CXR negative. He is ordered for a CTA C/A/P to r/o aortic dissection. Cardiology asked to evaluate for chest pain    Past Medical History:  Diagnosis Date  . GERD (gastroesophageal reflux disease)   . Mixed hyperlipidemia     History reviewed. No pertinent surgical history.   Home Medications:  Prior to Admission medications   Medication Sig Start Date End Date Taking? Authorizing Provider  Esomeprazole Magnesium (NEXIUM 24HR) 20 MG TBEC Take 20 mg by mouth daily.   Yes [provider]  naproxen sodium (ALEVE) 220 MG tablet Take 440 mg by mouth 2 (two) times daily as needed (back pain).   Yes [provider]  tetrahydrozoline-zinc (VISINE-AC) 0.05-0.25 % ophthalmic solution Place 2 drops into both eyes 3 (three) times daily as needed (red eyes dry eye).   Yes [provider]  erythromycin ophthalmic ointment Place a 1/2 inch ribbon of ointment into the lower eyelid of the left eye 4 times a day for 5 days Patient not taking: Reported on 10/24/2017 09/24/12   Sciacca, Ashok Cordia, PA-C  omeprazole (PRILOSEC) 20 MG capsule Take 1 capsule (20 mg total) by mouth daily for 14 days. 10/24/17 11/07/17  Linwood Dibbles, MD    Inpatient Medications: Scheduled Meds: . [START ON 07/04/2019] aspirin EC  81 mg Oral Daily  .  atorvastatin  20 mg Oral Daily  . sodium chloride (PF)       Continuous Infusions: . heparin 1,050 Units/hr (07/03/19 1110)   PRN Meds: nitroGLYCERIN  Allergies:    Allergies  Allergen Reactions  . Penicillins Anaphylaxis and Hives    Has patient had a PCN reaction causing immediate rash, facial/tongue/throat swelling, SOB or lightheadedness with hypotension: Yes Has patient had a PCN reaction causing severe rash involving mucus membranes or skin necrosis:  Yes Has patient had a PCN reaction that required hospitalization: No Has patient had a PCN reaction occurring within the last 10 years: Yes If all of the above answers are "NO", then may proceed with Cephalosporin use.   Marland Kitchen Amoxicillin Hives    Social History:   Social History   Socioeconomic History  . Marital status: Single    Spouse name: Not on file  . Number of children: Not on file  . Years of education: Not on file  . Highest education level: Not on file  Occupational History  . Not on file  Tobacco Use  . Smoking status: Current Every Day Smoker    Packs/day: 0.50    Years: 20.00    Pack years: 10.00    Types: Cigarettes  . Smokeless tobacco: Never Used  Substance and Sexual Activity  . Alcohol use: Yes    Alcohol/week: 18.0 standard drinks    Types: 18 Cans of beer per week    Comment: 2 40oz beers per day  . Drug use: Yes    Frequency: 0.2 times per week    Types: Marijuana, Cocaine  . Sexual activity: Not Currently  Other Topics Concern  . Not on file  Social History Narrative  . Not on file   Social Determinants of Health   Financial Resource Strain:   . Difficulty of Paying Living Expenses:   Food Insecurity:   . Worried About Programme researcher, broadcasting/film/video in the Last Year:   . Barista in the Last Year:   Transportation Needs:   . Freight forwarder (Medical):   Marland Kitchen Lack of Transportation (Non-Medical):   Physical Activity:   . Days of Exercise per Week:   . Minutes of Exercise per Session:   Stress:   . Feeling of Stress :   Social Connections:   . Frequency of Communication with Friends and Family:   . Frequency of Social Gatherings with Friends and Family:   . Attends Religious Services:   . Active Member of Clubs or Organizations:   . Attends Banker Meetings:   Marland Kitchen Marital Status:   Intimate Partner Violence:   . Fear of Current or Ex-Partner:   . Emotionally Abused:   Marland Kitchen Physically Abused:   . Sexually Abused:     Family  History:   Family History  Problem Relation Age of Onset  . CAD Neg Hx   . Heart failure Neg Hx      ROS:  Please see the history of present illness.  ROS  All other ROS reviewed and negative.     Physical Exam/Data:   Vitals:   07/03/19 1015 07/03/19 1030 07/03/19 1045 07/03/19 1100  BP:  (!) 142/96  (!) 144/102  Pulse: 74 65 69 72  Resp: 20 18 15 19   Temp:      TempSrc:      SpO2: 97% 98% 100% 99%  Weight:      Height:       No intake  or output data in the 24 hours ending 07/03/19 1201 Filed Weights   07/03/19 0754  Weight: 74.8 kg   Body mass index is 22.38 kg/m.  General:  Well nourished, well developed, in no acute distress HEENT: sclera anicteric  Neck: no JVD Vascular: No carotid bruits; distal pulses 2+ bilaterally Cardiac:  normal S1, S2; RRR; no murmurs, rubs, or gallops Lungs:  clear to auscultation bilaterally, no wheezing, rhonchi or rales  Abd: NABS, soft, nontender, no hepatomegaly Ext: no edema Musculoskeletal:  No deformities, BUE and BLE strength normal and equal Skin: warm and dry  Neuro:  CNs 2-12 intact, no focal abnormalities noted Psych:  Normal affect   EKG:  The EKG was personally reviewed and demonstrates:  sinus rhythm with rate 73 bpm, early repol, isolated biphasic T wave in III, no STE/D, no comparison and no significant change on serial EKG.   Telemetry:  Telemetry was personally reviewed and demonstrates:  NSR  Relevant CV Studies: Echo pending  Laboratory Data:  Chemistry Recent Labs  Lab 07/03/19 0550  NA 139  K 3.3*  CL 107  CO2 23  GLUCOSE 91  BUN 7  CREATININE 0.79  CALCIUM 8.4*  GFRNONAA >60  GFRAA >60  ANIONGAP 9    No results for input(s): PROT, ALBUMIN, AST, ALT, ALKPHOS, BILITOT in the last 168 hours. Hematology Recent Labs  Lab 07/03/19 0550  WBC 9.2  RBC 3.54*  HGB 11.9*  HCT 35.2*  MCV 99.4  MCH 33.6  MCHC 33.8  RDW 15.1  PLT 331   Cardiac EnzymesNo results for input(s): TROPONINI in the  last 168 hours. No results for input(s): TROPIPOC in the last 168 hours.  BNPNo results for input(s): BNP, PROBNP in the last 168 hours.  DDimer No results for input(s): DDIMER in the last 168 hours.  Radiology/Studies:  DG Chest 2 View  Result Date: 07/03/2019 CLINICAL DATA:  Chest pain and shortness of breath EXAM: CHEST - 2 VIEW COMPARISON:  None. FINDINGS: Normal heart size and mediastinal contours. No acute infiltrate or edema. No effusion or pneumothorax. No acute osseous findings. IMPRESSION: No active cardiopulmonary disease. Electronically Signed   By: Marnee Spring M.D.   On: 07/03/2019 06:15   CT Angio Chest/Abd/Pel for Dissection W and/or Wo Contrast  Result Date: 07/03/2019 CLINICAL DATA:  Chest pain EXAM: CT ANGIOGRAPHY CHEST, ABDOMEN AND PELVIS TECHNIQUE: Non-contrast CT of the chest was initially obtained. Multidetector CT imaging through the chest, abdomen and pelvis was performed using the standard protocol during bolus administration of intravenous contrast. Multiplanar reconstructed images and MIPs were obtained and reviewed to evaluate the vascular anatomy. CONTRAST:  OMNIPAQUE IOHEXOL 350 MG/ML SOLN COMPARISON:  CT abdomen-pelvis 01/02/2007, same day chest x-ray FINDINGS: CTA CHEST FINDINGS Cardiovascular: Precontrast image reveals no evidence of intramural hematoma. Preferential opacification of the thoracic aorta. No evidence of thoracic aortic aneurysm or dissection. Scattered atherosclerotic calcification of the aorta and coronary arteries. Normal heart size. No pericardial effusion. Main pulmonary trunk is nondilated. Mediastinum/Nodes: Multiple right-sided thyroid nodules, largest measuring up to 13 mm inferiorly. No axillary, mediastinal, or hilar lymphadenopathy. Trachea and esophagus within normal limits. Lungs/Pleura: Small biapical blebs with mild pleuroparenchymal scarring. Minimal bibasilar subsegmental atelectasis. No focal airspace consolidation, pleural  effusion, or pneumothorax. Musculoskeletal: Absence of the right pectoralis musculature. No acute or significant osseous findings. Review of the MIP images confirms the above findings. CTA ABDOMEN AND PELVIS FINDINGS VASCULAR Aorta: Normal caliber aorta without aneurysm, dissection, vasculitis or significant stenosis.  Minimal scattered atherosclerotic calcification. Celiac: Patent without evidence of aneurysm, dissection, vasculitis or significant stenosis. SMA: Patent without evidence of aneurysm, dissection, vasculitis or significant stenosis. Renals: Single bilateral renal artery. Both renal arteries are patent without evidence of aneurysm, dissection, vasculitis, fibromuscular dysplasia or significant stenosis. IMA: Patent. Inflow: Patent without evidence of aneurysm, dissection, vasculitis or significant stenosis. Scattered atherosclerotic calcification. Veins: No obvious venous abnormality within the limitations of this arterial phase study. Review of the MIP images confirms the above findings. NON-VASCULAR Hepatobiliary: No focal liver abnormality is seen. No gallstones, gallbladder wall thickening, or biliary dilatation. Pancreas: Unremarkable. No pancreatic ductal dilatation or surrounding inflammatory changes. Spleen: Unremarkable. Adrenals/Urinary Tract: Normal adrenal glands. Kidneys enhance symmetrically. No focal lesion, stone, or hydronephrosis. Bilateral ureters and urinary bladder appear unremarkable. Stomach/Bowel: Stomach is within normal limits. Appendix appears normal. No evidence of bowel wall thickening, distention, or inflammatory changes. Lymphatic: No abdominopelvic lymphadenopathy. Reproductive: Prostate is unremarkable. Other: No free air, free fluid, or abdominal wall hernia. Musculoskeletal: Degenerative disc disease of L5-S1. No acute or significant osseous findings. Review of the MIP images confirms the above findings. IMPRESSION: 1. No evidence of aortic aneurysm or dissection. 2. No  acute findings within the chest, abdomen or pelvis. 3. Multiple right-sided thyroid nodules, largest measuring up to 13 mm. Further evaluation with nonemergent thyroid ultrasound is recommended. (ref: J Am Coll Radiol. 2015 Feb;12(2): 143-50). 4. Aortic atherosclerosis. (ICD10-I70.0). 5. Absence of the right pectoralis musculature, which may be postsurgical or related to [place] syndrome. Electronically Signed   By: Duanne GuessNicholas  Plundo D.O.   On: 07/03/2019 10:05    Assessment and Plan:   1. Chest pain in the setting of cocaine use: patient presented with chest pain for the past 12 hours following cocaine use yesterday. CTA C/A/P was without aortic aneurysm or dissectionNo known CAD history, though noted to have scattered atherosclerotic calcifications of the aorta and coronary arteries on CTA C/A/P today. EKG shows no ischemic changes. HsTrop 432>765. Risk factors for CAD include likely unmanaged HTN, hypertriglyceridemia, pre-diabetes, and tobacco abuse. Suspect chest pain is 2/2 coronary spasm in the setting of cocaine use.  - Await Utox, though he admits to cocaine use - Favor conservative management with heparin x24 hours given elevated trops - Will check an echocardiogram to evaluate LV function - Avoid BBlocker use in the setting of cocaine use. - Can consider an inpatient vs outpatient ischemic evaluation pending echo results  2. Elevated BP: suspect unmanaged HTN - If BP remains elevated overnight, favor starting amlodipine 5mg  daily for BP control if LV function is normal  3. Mixed HLD: Triglycerides 276 this admission, LDL 84. He does have coronary artery calcifications on CTA chest so likely has some underlying CAD. Favor aggressive risk factor modifications - Would start atorvastatin 20mg  daily  4. Pre-diabetes: A1C 5.7 - Encourage healthy dietary/lifestyle modifications to prevent progression  5. Tobacco abuse: smoking 1/2 ppd. Risks of continued use discussed - Continue to encourage  cessation  6. Cocaine abuse: reports smoking ~once per week. Risks of continue use discussed - Continue to encourage abstinence  7. ETOH abuse: drinks 2 40oz beers a day. No history of withdrawals. Last drink yesterday evening.  - Would monitor CIWA while admitted - will defer to primary team.     For questions or updates, please contact [facility] Please consult [email or web address] for contact info under Cardiology/STEMI.   Signed, Beatriz StallionKrista M. Kroeger, PA-C  07/03/2019 12:01 PM [phone]  Patient seen and examined with Judy PimpleKrista Kroeger,  PA-C.  Agree as above, with the following exceptions and changes as noted below.  Mr. Abalos is a pleasant 54 year old gentleman who presents with an episode of chest pain after cocaine and alcohol use, with a rising troponin.. Gen: NAD, CV: RRR, no murmurs, Lungs: clear, Abd: soft, Extrem: Warm, well perfused, no edema, Neuro/Psych: alert and oriented x 3, normal mood and affect. All available labs, radiology testing, previous records reviewed.  The patient's mother is present as well, and we discussed the nature of cocaine's effects on the heart and the etiology of his current troponin elevation.  At the time of consultation, troponin was elevated at 432> 765.  However on evaluation, troponin is now 2143.  Patient is on a heparin drip.  We will obtain an echocardiogram to risk stratify in the setting of rising troponin and concern for ischemia.  He does have coronary artery calcifications noted on the CT chest abdomen pelvis performed earlier today.  Would recommend continuing heparin for approximately 48 hours, follow-up echocardiogram for wall motion abnormalities, and consideration of coronary angiography on Monday given significant troponin elevation.  While this could represent demand ischemia, it is concerning with his risk factors for ACS in the setting of drug use as well.  Elouise Munroe, MD 07/03/19 3:54 PM

## 2019-07-03 NOTE — ED Notes (Signed)
Date and time results received: 07/03/19 6:39 AM   Test: Troponin Critical Value: 432  Name of Provider Notified: Dr Read Drivers

## 2019-07-03 NOTE — ED Provider Notes (Signed)
Medical screening examination/treatment/procedure(s) were conducted as a shared visit with non-physician practitioner(s) and myself.  I personally evaluated the patient during the encounter.  EKG Interpretation  Date/Time:  Friday Jul 03 2019 05:14:13 EDT Ventricular Rate:  73 PR Interval:    QRS Duration: 97 QT Interval:  418 QTC Calculation: 461 R Axis:   50 Text Interpretation: Sinus rhythm Abnormal R-wave progression, early transition No previous ECGs available Confirmed by Paula Libra ([id number]) on 07/03/2019 5:12:81 AM 54 year old male who presents with right-sided chest pain that began after using cocaine.  Patient's EKG here does not show any evidence of acute ischemia.  Troponin is elevated.  His pain is been persistent for almost 12 hours.  Will CT scan to look for dissection.  Cardiology consulted and will see.  Will start heparin after results of scan.  Patient's pain did improve with nitroglycerin   Lorre Nick, MD 07/03/19 [phone]

## 2019-07-03 NOTE — ED Notes (Signed)
Heparin bolus given on pump a/o. Heparin drip up on pump a/o. Pt remains chest pain free at this time. Skin w/d/pink. Resp wnl, equal and non-labored. Mother at bedside. Cont to monitor.

## 2019-07-03 NOTE — Progress Notes (Signed)
  Echocardiogram 2D Echocardiogram has been performed.  Takyah Ciaramitaro A Lavarius Doughten 07/03/2019, 1:26 PM

## 2019-07-03 NOTE — Progress Notes (Addendum)
   Received page about rising troponin. Troponin now 4,193. Patient presented with chest pain in the setting of cocaine use. Echo showed LVEF of 60-65% with normal wall motion. Possibly demand ischemia secondary to coronary spasms. However, coronary artery calcifications were noted on CT earlier today. Plan was for IV Heparin for about 48 hours with consideration for coronary angiography on Monday. Patient is currently chest pain free. Therefore, will recheck EKG to make sure there are no acute ischemic changes and continue to trend troponin. Otherwise, will continue with plan outlined earlier today.   Corrin Parker, PA-C 07/03/2019 4:45 PM

## 2019-07-03 NOTE — Progress Notes (Signed)
ANTICOAGULATION CONSULT NOTE - Initial Consult  Pharmacy Consult for Heparin Indication: chest pain/ACS  Allergies  Allergen Reactions  . Penicillins Anaphylaxis and Hives    Has patient had a PCN reaction causing immediate rash, facial/tongue/throat swelling, SOB or lightheadedness with hypotension: Yes Has patient had a PCN reaction causing severe rash involving mucus membranes or skin necrosis: Yes Has patient had a PCN reaction that required hospitalization: No Has patient had a PCN reaction occurring within the last 10 years: Yes If all of the above answers are "NO", then may proceed with Cephalosporin use.   Marland Kitchen Amoxicillin Hives    Patient Measurements: Height: 6' (182.9 cm) Weight: 74.8 kg (165 lb) IBW/kg (Calculated) : 77.6 Heparin Dosing Weight: actual weight  Vital Signs: Temp: 98.7 F (37.1 C) (05/14 0700) Temp Source: Oral (05/14 0700) BP: 148/96 (05/14 0951) Pulse Rate: 76 (05/14 0951)  Labs: Recent Labs    07/03/19 0550 07/03/19 0734  HGB 11.9*  --   HCT 35.2*  --   PLT 331  --   CREATININE 0.79  --   TROPONINIHS 432* 765*    Estimated Creatinine Clearance: 113 mL/min (by C-G formula based on SCr of 0.79 mg/dL).   Medical History: History reviewed. No pertinent past medical history.  Medications:  (Not in a hospital admission)  Assessment: 54 y/o M admitted with CP after using cocaine. CT chest negative for acute findings.EKG without evidence of ischemia. Troponin elevated.   Goal of Therapy:  Heparin level 0.3-0.7 units/ml Monitor platelets by anticoagulation protocol: Yes   Plan:  Give 4000 units bolus x 1 Start heparin infusion at 1050 units/hr Check anti-Xa level in 6 hours and daily while on heparin Continue to monitor H&H and platelets  Luisa Hart D 07/03/2019,10:43 AM

## 2019-07-03 NOTE — ED Notes (Signed)
Pt sleeping. Arousable to voice. Denies chest pain, SHOB, n/v. Skin w/d/pink. Resp wnk, equal and non-labored. Cont to monitor.

## 2019-07-03 NOTE — Progress Notes (Signed)
ANTICOAGULATION CONSULT NOTE - Follow Up Consult  Pharmacy Consult for Heparin Indication: chest pain/ACS  Allergies  Allergen Reactions  . Penicillins Anaphylaxis and Hives    Has patient had a PCN reaction causing immediate rash, facial/tongue/throat swelling, SOB or lightheadedness with hypotension: Yes Has patient had a PCN reaction causing severe rash involving mucus membranes or skin necrosis: Yes Has patient had a PCN reaction that required hospitalization: No Has patient had a PCN reaction occurring within the last 10 years: Yes If all of the above answers are "NO", then may proceed with Cephalosporin use.   Marland Kitchen Amoxicillin Hives    Patient Measurements: Height: 6' (182.9 cm) Weight: 75.6 kg (166 lb 10.7 oz) IBW/kg (Calculated) : 77.6 Heparin Dosing Weight: actual weight  Vital Signs: Temp: 97.8 F (36.6 C) (05/14 1830) Temp Source: Oral (05/14 1830) BP: 127/71 (05/14 1830) Pulse Rate: 52 (05/14 1830)  Labs: Recent Labs    07/03/19 0550 07/03/19 0550 07/03/19 0734 07/03/19 1018 07/03/19 1035 07/03/19 1218 07/03/19 1700  HGB 11.9*  --   --   --   --   --   --   HCT 35.2*  --   --   --   --   --   --   PLT 331  --   --   --   --   --   --   APTT  --   --   --   --  25  --   --   LABPROT  --   --   --   --  13.6  --   --   INR  --   --   --   --  1.1  --   --   HEPARINUNFRC  --   --   --   --   --   --  0.38  CREATININE 0.79  --   --   --   --   --   --   TROPONINIHS 432*   < > 765* 2,143*  --  4,193*  --    < > = values in this interval not displayed.    Estimated Creatinine Clearance: 114.2 mL/min (by C-G formula based on SCr of 0.79 mg/dL).   Medical History: Past Medical History:  Diagnosis Date  . GERD (gastroesophageal reflux disease)   . Mixed hyperlipidemia     Medications:  Medications Prior to Admission  Medication Sig Dispense Refill Last Dose  . Esomeprazole Magnesium (NEXIUM 24HR) 20 MG TBEC Take 20 mg by mouth daily.   07/02/2019 at  Unknown time  . naproxen sodium (ALEVE) 220 MG tablet Take 440 mg by mouth 2 (two) times daily as needed (back pain).   Past Month at Unknown time  . tetrahydrozoline-zinc (VISINE-AC) 0.05-0.25 % ophthalmic solution Place 2 drops into both eyes 3 (three) times daily as needed (red eyes dry eye).   Past Week at Unknown time  . erythromycin ophthalmic ointment Place a 1/2 inch ribbon of ointment into the lower eyelid of the left eye 4 times a day for 5 days (Patient not taking: Reported on 10/24/2017) 3.5 g 0   . omeprazole (PRILOSEC) 20 MG capsule Take 1 capsule (20 mg total) by mouth daily for 14 days. 14 capsule 1    Assessment: 54 y/o M admitted with CP after using cocaine. CT chest negative for acute findings.EKG without evidence of ischemia. Troponin elevated. Cards consulted.  First heparin level 0.38 is therapeutic on 1050  units/hr No bleeding or complications reported  Goal of Therapy:  Heparin level 0.3-0.7 units/ml Monitor platelets by anticoagulation protocol: Yes   Plan:   Continue heparin infusion at 1050 units/hr  Check heparin level in 6hrs to confirm therapeutic  Daily heparin level and CBC  Monitor for signs/symptoms of bleeding  Peggyann Juba, PharmD, BCPS Pharmacy: [phone] 07/03/2019,7:07 PM

## 2019-07-03 NOTE — ED Provider Notes (Signed)
[facility] DEPT Provider Note   CSN: [id number] Arrival date & time: 07/03/19  0455     History Chief Complaint  Patient presents with  . Chest Pain    Ryan Owen is a 54 y.o. male history of GERD otherwise healthy.  Patient presents today for chest pain onset 9 PM last night.  Reports that he was drinking alcohol and using cocaine prior to onset of pain.  Describes sharp substernal chest pain moderate intensity constant nonradiating gradually improving with time no clear alleviating factors or aggravating factors.  Patient took Tums prior to arrival without relief.  Associated with mild shortness of breath when pain was most severe has resolved.  Denies recent illness, fever/chills, sore throat, cough/hemoptysis, abdominal pain, nausea/vomiting, diarrhea, extremity swelling/color change or any additional concerns. HPI     Past Medical History:  Diagnosis Date  . GERD (gastroesophageal reflux disease)   . Mixed hyperlipidemia     Patient Active Problem List   Diagnosis Date Noted  . NSTEMI (non-ST elevated myocardial infarction) ([place]) 07/03/2019    History reviewed. No pertinent surgical history.     Family History  Problem Relation Age of Onset  . CAD Neg Hx   . Heart failure Neg Hx     Social History   Tobacco Use  . Smoking status: Current Every Day Smoker    Packs/day: 0.50    Years: 20.00    Pack years: 10.00    Types: Cigarettes  . Smokeless tobacco: Never Used  Substance Use Topics  . Alcohol use: Yes    Alcohol/week: 18.0 standard drinks    Types: 18 Cans of beer per week    Comment: 2 40oz beers per day  . Drug use: Yes    Frequency: 0.2 times per week    Types: Marijuana, Cocaine    Home Medications Prior to Admission medications   Medication Sig Start Date End Date Taking? Authorizing Provider  Esomeprazole Magnesium (NEXIUM 24HR) 20 MG TBEC Take 20 mg by mouth daily.   Yes [provider]    naproxen sodium (ALEVE) 220 MG tablet Take 440 mg by mouth 2 (two) times daily as needed (back pain).   Yes [provider]  tetrahydrozoline-zinc (VISINE-AC) 0.05-0.25 % ophthalmic solution Place 2 drops into both eyes 3 (three) times daily as needed (red eyes dry eye).   Yes [provider]  erythromycin ophthalmic ointment Place a 1/2 inch ribbon of ointment into the lower eyelid of the left eye 4 times a day for 5 days Patient not taking: Reported on 10/24/2017 09/24/12   Sciacca, Mable Fill, PA-C  omeprazole (PRILOSEC) 20 MG capsule Take 1 capsule (20 mg total) by mouth daily for 14 days. 10/24/17 11/07/17  Dorie Rank, MD    Allergies    Penicillins and Amoxicillin  Review of Systems   Review of Systems Ten systems are reviewed and are negative for acute change except as noted in the HPI  Physical Exam Updated Vital Signs BP (!) 144/102   Pulse 72   Temp 98.7 F (37.1 C) (Oral)   Resp 19   Ht 6' (1.829 m)   Wt 74.8 kg   SpO2 99%   BMI 22.38 kg/m   Physical Exam Constitutional:      General: He is not in acute distress.    Appearance: Normal appearance. He is well-developed. He is not ill-appearing or diaphoretic.  HENT:     Head: Normocephalic and atraumatic.     Right  Ear: External ear normal.     Left Ear: External ear normal.     Nose: Nose normal.  Eyes:     General: Vision grossly intact. Gaze aligned appropriately.     Pupils: Pupils are equal, round, and reactive to light.  Neck:     Trachea: Trachea and phonation normal. No tracheal deviation.  Cardiovascular:     Rate and Rhythm: Normal rate and regular rhythm.     Pulses:          Dorsalis pedis pulses are 2+ on the right side and 2+ on the left side.     Heart sounds: Normal heart sounds.  Pulmonary:     Effort: Pulmonary effort is normal. No respiratory distress.     Breath sounds: Normal breath sounds.  Abdominal:     General: There is no distension.     Palpations: Abdomen is soft.      Tenderness: There is no abdominal tenderness. There is no guarding or rebound.  Musculoskeletal:        General: Normal range of motion.     Cervical back: Normal range of motion.     Right lower leg: No tenderness. No edema.     Left lower leg: No tenderness. No edema.  Skin:    General: Skin is warm and dry.  Neurological:     Mental Status: He is alert.     GCS: GCS eye subscore is 4. GCS verbal subscore is 5. GCS motor subscore is 6.     Comments: Speech is clear and goal oriented, follows commands Major Cranial nerves without deficit, no facial droop Moves extremities without ataxia, coordination intact  Psychiatric:        Behavior: Behavior normal.     ED Results / Procedures / Treatments   Labs (all labs ordered are listed, but only abnormal results are displayed) Labs Reviewed  BASIC METABOLIC PANEL - Abnormal; Notable for the following components:      Result Value   Potassium 3.3 (*)    Calcium 8.4 (*)    All other components within normal limits  CBC - Abnormal; Notable for the following components:   RBC 3.54 (*)    Hemoglobin 11.9 (*)    HCT 35.2 (*)    All other components within normal limits  RAPID URINE DRUG SCREEN, HOSP PERFORMED - Abnormal; Notable for the following components:   Cocaine POSITIVE (*)    Tetrahydrocannabinol POSITIVE (*)    All other components within normal limits  LIPID PANEL - Abnormal; Notable for the following components:   Triglycerides 276 (*)    VLDL 55 (*)    All other components within normal limits  HEMOGLOBIN A1C - Abnormal; Notable for the following components:   Hgb A1c MFr Bld 5.7 (*)    All other components within normal limits  TROPONIN I (HIGH SENSITIVITY) - Abnormal; Notable for the following components:   Troponin I (High Sensitivity) 432 (*)    All other components within normal limits  TROPONIN I (HIGH SENSITIVITY) - Abnormal; Notable for the following components:   Troponin I (High Sensitivity) 765 (*)    All  other components within normal limits  SARS CORONAVIRUS 2 BY RT PCR (HOSPITAL ORDER, PERFORMED IN [facility])  APTT  PROTIME-INR  TROPONIN I (HIGH SENSITIVITY)  TROPONIN I (HIGH SENSITIVITY)    EKG EKG Interpretation  Date/Time:  Friday Jul 03 2019 05:14:13 EDT Ventricular Rate:  73 PR Interval:  QRS Duration: 97 QT Interval:  418 QTC Calculation: 461 R Axis:   50 Text Interpretation: Sinus rhythm Abnormal R-wave progression, early transition No previous ECGs available Confirmed by Paula Libra ([id number]) on 07/03/2019 5:41:46 AM   Radiology DG Chest 2 View  Result Date: 07/03/2019 CLINICAL DATA:  Chest pain and shortness of breath EXAM: CHEST - 2 VIEW COMPARISON:  None. FINDINGS: Normal heart size and mediastinal contours. No acute infiltrate or edema. No effusion or pneumothorax. No acute osseous findings. IMPRESSION: No active cardiopulmonary disease. Electronically Signed   By: Marnee Spring M.D.   On: 07/03/2019 06:15   CT Angio Chest/Abd/Pel for Dissection W and/or Wo Contrast  Result Date: 07/03/2019 CLINICAL DATA:  Chest pain EXAM: CT ANGIOGRAPHY CHEST, ABDOMEN AND PELVIS TECHNIQUE: Non-contrast CT of the chest was initially obtained. Multidetector CT imaging through the chest, abdomen and pelvis was performed using the standard protocol during bolus administration of intravenous contrast. Multiplanar reconstructed images and MIPs were obtained and reviewed to evaluate the vascular anatomy. CONTRAST:  OMNIPAQUE IOHEXOL 350 MG/ML SOLN COMPARISON:  CT abdomen-pelvis 01/02/2007, same day chest x-ray FINDINGS: CTA CHEST FINDINGS Cardiovascular: Precontrast image reveals no evidence of intramural hematoma. Preferential opacification of the thoracic aorta. No evidence of thoracic aortic aneurysm or dissection. Scattered atherosclerotic calcification of the aorta and coronary arteries. Normal heart size. No pericardial effusion. Main pulmonary trunk is nondilated.  Mediastinum/Nodes: Multiple right-sided thyroid nodules, largest measuring up to 13 mm inferiorly. No axillary, mediastinal, or hilar lymphadenopathy. Trachea and esophagus within normal limits. Lungs/Pleura: Small biapical blebs with mild pleuroparenchymal scarring. Minimal bibasilar subsegmental atelectasis. No focal airspace consolidation, pleural effusion, or pneumothorax. Musculoskeletal: Absence of the right pectoralis musculature. No acute or significant osseous findings. Review of the MIP images confirms the above findings. CTA ABDOMEN AND PELVIS FINDINGS VASCULAR Aorta: Normal caliber aorta without aneurysm, dissection, vasculitis or significant stenosis. Minimal scattered atherosclerotic calcification. Celiac: Patent without evidence of aneurysm, dissection, vasculitis or significant stenosis. SMA: Patent without evidence of aneurysm, dissection, vasculitis or significant stenosis. Renals: Single bilateral renal artery. Both renal arteries are patent without evidence of aneurysm, dissection, vasculitis, fibromuscular dysplasia or significant stenosis. IMA: Patent. Inflow: Patent without evidence of aneurysm, dissection, vasculitis or significant stenosis. Scattered atherosclerotic calcification. Veins: No obvious venous abnormality within the limitations of this arterial phase study. Review of the MIP images confirms the above findings. NON-VASCULAR Hepatobiliary: No focal liver abnormality is seen. No gallstones, gallbladder wall thickening, or biliary dilatation. Pancreas: Unremarkable. No pancreatic ductal dilatation or surrounding inflammatory changes. Spleen: Unremarkable. Adrenals/Urinary Tract: Normal adrenal glands. Kidneys enhance symmetrically. No focal lesion, stone, or hydronephrosis. Bilateral ureters and urinary bladder appear unremarkable. Stomach/Bowel: Stomach is within normal limits. Appendix appears normal. No evidence of bowel wall thickening, distention, or inflammatory changes.  Lymphatic: No abdominopelvic lymphadenopathy. Reproductive: Prostate is unremarkable. Other: No free air, free fluid, or abdominal wall hernia. Musculoskeletal: Degenerative disc disease of L5-S1. No acute or significant osseous findings. Review of the MIP images confirms the above findings. IMPRESSION: 1. No evidence of aortic aneurysm or dissection. 2. No acute findings within the chest, abdomen or pelvis. 3. Multiple right-sided thyroid nodules, largest measuring up to 13 mm. Further evaluation with nonemergent thyroid ultrasound is recommended. (ref: J Am Coll Radiol. 2015 Feb;12(2): 143-50). 4. Aortic atherosclerosis. (ICD10-I70.0). 5. Absence of the right pectoralis musculature, which may be postsurgical or related to [place] syndrome. Electronically Signed   By: Duanne Guess D.O.   On: 07/03/2019 10:05    Procedures .  Critical Care Performed by: Bill SalinasMorelli, Kimberely Mccannon A, PA-C Authorized by: Bill SalinasMorelli, Maryuri Warnke A, PA-C   Critical care provider statement:    Critical care time (minutes):  42   Critical care was necessary to treat or prevent imminent or life-threatening deterioration of the following conditions:  Cardiac failure   Critical care was time spent personally by me on the following activities:  Discussions with consultants, evaluation of patient's response to treatment, examination of patient, ordering and performing treatments and interventions, ordering and review of laboratory studies, ordering and review of radiographic studies, pulse oximetry, re-evaluation of patient's condition, obtaining history from patient or surrogate, review of old charts and development of treatment plan with patient or surrogate   (including critical care time)  Medications Ordered in ED Medications  nitroGLYCERIN (NITROSTAT) SL tablet 0.4 mg (0.4 mg Sublingual Given 07/03/19 0725)  sodium chloride (PF) 0.9 % injection (has no administration in time range)  heparin ADULT infusion 100 units/mL (25000  units/28450mL sodium chloride 0.45%) (1,050 Units/hr Intravenous New Bag/Given 07/03/19 1110)  aspirin EC tablet 81 mg (has no administration in time range)  atorvastatin (LIPITOR) tablet 20 mg (has no administration in time range)  sodium chloride flush (NS) 0.9 % injection 3 mL (3 mLs Intravenous Given 07/03/19 0552)  aspirin EC tablet 325 mg (325 mg Oral Given 07/03/19 0714)  LORazepam (ATIVAN) injection 1 mg (1 mg Intravenous Given 07/03/19 0805)  iohexol (OMNIPAQUE) 350 MG/ML injection 100 mL (100 mLs Intravenous Contrast Given 07/03/19 0917)  heparin bolus via infusion 4,000 Units (4,000 Units Intravenous Bolus from Bag 07/03/19 1111)    ED Course  I have reviewed the triage vital signs and the nursing notes.  Pertinent labs & imaging results that were available during my care of the patient were reviewed by me and considered in my medical decision making (see chart for details).  Clinical Course as of Jul 02 1217  Fri Jul 03, 2019  [id number] Dr Katrinka BlazingSmith   [BM]  [id number] Dr. Katrinka BlazingSmith   [BM]  [id number] Carmie Kannerallie PA-C   [BM]  [phone] [facility]   [BM]  0959 Yolande JollyKroeger PA-C   [BM]  1211 Hospitalist   [BM]    Clinical Course User Index [BM] Elizabeth PalauMorelli, Gwenetta Devos A, PA-C   MDM Rules/Calculators/A&P                     54 year old male presents with chest pain onset 9pm after using cocaine and alcohol last night. Attempted antacids without relief. Pain has been resolving but still present. Triage lab work reviewed and is significant for troponin of 432. BMP shows no emergent electrolyte derangement. CBC shows no leukocytosis and hemoglobin of 11.9. EKG reveiwed by Dr. Read DriversMolpus without acute ischemic changes. On exam patient is well appearing NAD. VSS. Cardiopulmonary exam unremarkable. NVI to all four extremites, no evidence of DVT. Aspirin 324 mg ordered. Discussed case with Dr. Read DriversMolpus who advises nitroglycerin. Will reassess. - Pain improved following Nitroglycerin. Discussed case with cardiologist Dr. Katrinka BlazingSmith at  7:34 AM who will have team come to evaluate the patient. Patient seen and evaluated by Dr. Freida BusmanAllen, will obtain dissection study prior to initiation of heparin. Ativan ordered. - 8:10 AM: Patient reassessed resting comfortably no acute distress.  Reports pain has 1.5/10 intensity.  Vital signs stable.  States understanding of care plan and is agreeable to CT scan and admission. - 9:16 AM: Delta troponin elevated at 765.  I reevaluated the patient he is in transit to the CT scanner for  his dissection study.  He reports his pain is now 1/10 in severity improved after Ativan.  Plan of care is to consult cardiology back to inform them of rise in troponin.  After dissection study results, if negative will initiate heparin. - 9:23 AM: Discussed case with cardiology spoke with RN with Dr. Katrinka Blazing.  Advises that rounding team will be seeing patient at [facility].  I then spoke with cardiology physician assistant [facility] who advises that PA Kroeger at [facility] will be seeing patient shortly.  I then sent page 2 Kroeger PA-C to inform of elevation of troponin. - 9:43 AM: [facility] radiology to have CT scan moved up in queue. - 9:59 AM: Discussed case with cardiology PA Kroeger who is on way to see patient. - 10:08 AM:  IMPRESSION:  1. No evidence of aortic aneurysm or dissection.  2. No acute findings within the chest, abdomen or pelvis.  3. Multiple right-sided thyroid nodules, largest measuring up to 13  mm. Further evaluation with nonemergent thyroid ultrasound is  recommended. (ref: J Am Coll Radiol. 2015 Feb;12(2): 143-50).  4. Aortic atherosclerosis. (ICD10-I70.0).  5. Absence of the right pectoralis musculature, which may be  postsurgical or related to [place] syndrome.   Heparin ordered.  Patient reevaluated remained stable. - 12:11 AM: Discussed case with Dr. Dayna Barker, patient has been admitted to hospitalist service. - 12:13 AM: Patient reevaluated he is resting comfortably no acute  distress he has no complaints or concerns at this time states understanding of care plan and is agreeable for admission.   Note: Portions of this report may have been transcribed using voice recognition software. Every effort was made to ensure accuracy; however, inadvertent computerized transcription errors may still be present. Final Clinical Impression(s) / ED Diagnoses Final diagnoses:  NSTEMI (non-ST elevated myocardial infarction) [facility])    Rx / DC Orders ED Discharge Orders    None       Elizabeth Palau 07/03/19 1219    Lorre Nick, MD 07/04/19 1549

## 2019-07-03 NOTE — Progress Notes (Addendum)
Pharmacy: Re-heparin  Patient's a 54 y.o M presented to the ED on 5/14 with c/o CP after consuming cocaine.  He was found to have elevated troponin and EKG changes.  He is currently on heparin drip for ACS. Cardiology team recom. to treat with heparin drip for ~48 hrs and possible coronary angiography on 07/06/19.  - Heparin level is sub-therapeutic at 0.18 - per pt's RN, no issues with IV line and bo bleeding noted  Goal of Therapy:  Heparin level 0.3-0.7 units/ml Monitor platelets by anticoagulation protocol: Yes  Plan: - heparin 2000 units IV bolus, then increase drip to 1300 units/hr - check 6 hr heparin level - monitor for s/sx bleeding  Dorna Leitz, PharmD, BCPS 07/03/2019 11:51 PM

## 2019-07-03 NOTE — ED Triage Notes (Signed)
Pt c/o chest pain onset 6 hrs ago took antiacid and H2 blocker w/o relief

## 2019-07-03 NOTE — ED Notes (Signed)
Enters my care. Report from night RN. Pt resting in stretcher. Received 1 Nitro prior to this RN arrival. Pt sts pain was 7 and now is 5. Described as sharp midsternal chest pain which worsens with deep breath and movement. Non-radiating. Denies n/v or SHOB at this time. 2nd Nitro  and ASA given a/o. Repeat EKG obt. Cont to monitor.

## 2019-07-03 NOTE — ED Notes (Signed)
Pt not able to give urine at the moment  urinal at bedside.

## 2019-07-03 NOTE — H&P (Signed)
History and Physical    Ryan Owen ZHG:[id number] DOB: August 09, 1965 DOA: 07/03/2019  PCP: Patient, No Pcp Per   Patient coming from:  Home  Chief Complaint: Chest pain  HPI: Ryan Owen is a 54 y.o. male with medical history significant for GERD, tobacco abuse, alcohol use, hyperlipidemia to the ED with chest pain that began after using cocaine.  Patient reported drinking alcohol and using cocaine yesterday and started to have substernal chest pain.  Pain is described as sharp in nature worse with movement and nonradiating, worse with deep breath not relieved by Tums and H2 blocker at home which prompted him to visit the ED.Patient otherwise denies any nausea, vomiting,shortness of breath, fever, chills, headache, focal weakness, numbness tingling, speech difficulties.  He also drinks 240 ounce beer once a day and uses cocaine about once a week and smokes half pack per day.  He denies any significant family history of cardiac history or cancer in the family.Currently having chest pain 4.5/10.    ED Course: Vitals stable, on RA saturating well.  Routine labs stable with potassium low at 3.3, initial troponin positive 432> 735>2143. HDL 52 and LDL 84.   Review of Systems: All systems were reviewed and were negative except as mentioned in HPI above. Negative for fever Negative for shortness of breath  Past Medical History:  Diagnosis Date  . GERD (gastroesophageal reflux disease)   . Mixed hyperlipidemia     History reviewed. No pertinent surgical history.   reports that he has been smoking cigarettes. He has a 10.00 pack-year smoking history. He has never used smokeless tobacco. He reports current alcohol use of about 18.0 standard drinks of alcohol per week. He reports current drug use. Frequency: 0.20 times per week. Drugs: Marijuana and Cocaine.  Allergies  Allergen Reactions  . Penicillins Anaphylaxis and Hives    Has patient had a PCN reaction causing immediate rash,  facial/tongue/throat swelling, SOB or lightheadedness with hypotension: Yes Has patient had a PCN reaction causing severe rash involving mucus membranes or skin necrosis: Yes Has patient had a PCN reaction that required hospitalization: No Has patient had a PCN reaction occurring within the last 10 years: Yes If all of the above answers are "NO", then may proceed with Cephalosporin use.   Marland Kitchen Amoxicillin Hives    Family History  Problem Relation Age of Onset  . CAD Neg Hx   . Heart failure Neg Hx      Prior to Admission medications   Medication Sig Start Date End Date Taking? Authorizing Provider  Esomeprazole Magnesium (NEXIUM 24HR) 20 MG TBEC Take 20 mg by mouth daily.   Yes [provider]  naproxen sodium (ALEVE) 220 MG tablet Take 440 mg by mouth 2 (two) times daily as needed (back pain).   Yes [provider]  tetrahydrozoline-zinc (VISINE-AC) 0.05-0.25 % ophthalmic solution Place 2 drops into both eyes 3 (three) times daily as needed (red eyes dry eye).   Yes [provider]  erythromycin ophthalmic ointment Place a 1/2 inch ribbon of ointment into the lower eyelid of the left eye 4 times a day for 5 days Patient not taking: Reported on 10/24/2017 09/24/12   Sciacca, Ashok Cordia, PA-C  omeprazole (PRILOSEC) 20 MG capsule Take 1 capsule (20 mg total) by mouth daily for 14 days. 10/24/17 11/07/17  Linwood Dibbles, MD    Physical Exam: Vitals:   07/03/19 1015 07/03/19 1030 07/03/19 1045 07/03/19 1100  BP:  (!) 142/96  (!) 144/102  Pulse: 74  65 69 72  Resp: 20 18 15 19   Temp:      TempSrc:      SpO2: 97% 98% 100% 99%  Weight:      Height:        General exam: AAOx3,NAD, weak appearing. HEENT:Oral mucosa moist, Ear/Nose WNL grossly, dentition normal. Respiratory system: bilaterally clear,no wheezing or crackles,no use of accessory muscle Cardiovascular system: S1 & S2 +, No JVD,. Gastrointestinal system: Abdomen soft, NT,ND, BS+ Nervous System:Alert, awake,  moving extremities and grossly nonfocal Extremities: No edema, distal peripheral pulses palpable.  Skin: No rashes,no icterus. MSK: Normal muscle bulk,tone, power   Labs on Admission: I have personally reviewed following labs and imaging studies  CBC: Recent Labs  Lab 07/03/19 0550  WBC 9.2  HGB 11.9*  HCT 35.2*  MCV 99.4  PLT 681   Basic Metabolic Panel: Recent Labs  Lab 07/03/19 0550  NA 139  K 3.3*  CL 107  CO2 23  GLUCOSE 91  BUN 7  CREATININE 0.79  CALCIUM 8.4*   GFR: Estimated Creatinine Clearance: 113 mL/min (by C-G formula based on SCr of 0.79 mg/dL). Liver Function Tests: No results for input(s): AST, ALT, ALKPHOS, BILITOT, PROT, ALBUMIN in the last 168 hours. No results for input(s): LIPASE, AMYLASE in the last 168 hours. No results for input(s): AMMONIA in the last 168 hours. Coagulation Profile: Recent Labs  Lab 07/03/19 1035  INR 1.1   Cardiac Enzymes: No results for input(s): CKTOTAL, CKMB, CKMBINDEX, TROPONINI in the last 168 hours. BNP (last 3 results) No results for input(s): PROBNP in the last 8760 hours. HbA1C: Recent Labs    07/03/19 0550  HGBA1C 5.7*   CBG: No results for input(s): GLUCAP in the last 168 hours. Lipid Profile: Recent Labs    07/03/19 0734  CHOL 191  HDL 52  LDLCALC 84  TRIG 276*  CHOLHDL 3.7   Thyroid Function Tests: No results for input(s): TSH, T4TOTAL, FREET4, T3FREE, THYROIDAB in the last 72 hours. Anemia Panel: No results for input(s): VITAMINB12, FOLATE, FERRITIN, TIBC, IRON, RETICCTPCT in the last 72 hours. Urine analysis:    Component Value Date/Time   COLORURINE STRAW (A) 10/24/2017 1729   APPEARANCEUR CLEAR 10/24/2017 1729   LABSPEC 1.013 10/24/2017 1729   PHURINE 5.0 10/24/2017 1729   GLUCOSEU NEGATIVE 10/24/2017 1729   HGBUR NEGATIVE 10/24/2017 1729   BILIRUBINUR NEGATIVE 10/24/2017 1729   KETONESUR NEGATIVE 10/24/2017 1729   PROTEINUR NEGATIVE 10/24/2017 1729   NITRITE NEGATIVE 10/24/2017  1729   LEUKOCYTESUR MODERATE (A) 10/24/2017 1729    Radiological Exams on Admission: DG Chest 2 View  Result Date: 07/03/2019 CLINICAL DATA:  Chest pain and shortness of breath EXAM: CHEST - 2 VIEW COMPARISON:  None. FINDINGS: Normal heart size and mediastinal contours. No acute infiltrate or edema. No effusion or pneumothorax. No acute osseous findings. IMPRESSION: No active cardiopulmonary disease. Electronically Signed   By: Monte Fantasia M.D.   On: 07/03/2019 06:15   CT Angio Chest/Abd/Pel for Dissection W and/or Wo Contrast  Result Date: 07/03/2019 CLINICAL DATA:  Chest pain EXAM: CT ANGIOGRAPHY CHEST, ABDOMEN AND PELVIS TECHNIQUE: Non-contrast CT of the chest was initially obtained. Multidetector CT imaging through the chest, abdomen and pelvis was performed using the standard protocol during bolus administration of intravenous contrast. Multiplanar reconstructed images and MIPs were obtained and reviewed to evaluate the vascular anatomy. CONTRAST:  162mL OMNIPAQUE IOHEXOL 350 MG/ML SOLN COMPARISON:  CT abdomen-pelvis 01/02/2007, same day chest x-ray FINDINGS: CTA CHEST FINDINGS  Cardiovascular: Precontrast image reveals no evidence of intramural hematoma. Preferential opacification of the thoracic aorta. No evidence of thoracic aortic aneurysm or dissection. Scattered atherosclerotic calcification of the aorta and coronary arteries. Normal heart size. No pericardial effusion. Main pulmonary trunk is nondilated. Mediastinum/Nodes: Multiple right-sided thyroid nodules, largest measuring up to 13 mm inferiorly. No axillary, mediastinal, or hilar lymphadenopathy. Trachea and esophagus within normal limits. Lungs/Pleura: Small biapical blebs with mild pleuroparenchymal scarring. Minimal bibasilar subsegmental atelectasis. No focal airspace consolidation, pleural effusion, or pneumothorax. Musculoskeletal: Absence of the right pectoralis musculature. No acute or significant osseous findings. Review of  the MIP images confirms the above findings. CTA ABDOMEN AND PELVIS FINDINGS VASCULAR Aorta: Normal caliber aorta without aneurysm, dissection, vasculitis or significant stenosis. Minimal scattered atherosclerotic calcification. Celiac: Patent without evidence of aneurysm, dissection, vasculitis or significant stenosis. SMA: Patent without evidence of aneurysm, dissection, vasculitis or significant stenosis. Renals: Single bilateral renal artery. Both renal arteries are patent without evidence of aneurysm, dissection, vasculitis, fibromuscular dysplasia or significant stenosis. IMA: Patent. Inflow: Patent without evidence of aneurysm, dissection, vasculitis or significant stenosis. Scattered atherosclerotic calcification. Veins: No obvious venous abnormality within the limitations of this arterial phase study. Review of the MIP images confirms the above findings. NON-VASCULAR Hepatobiliary: No focal liver abnormality is seen. No gallstones, gallbladder wall thickening, or biliary dilatation. Pancreas: Unremarkable. No pancreatic ductal dilatation or surrounding inflammatory changes. Spleen: Unremarkable. Adrenals/Urinary Tract: Normal adrenal glands. Kidneys enhance symmetrically. No focal lesion, stone, or hydronephrosis. Bilateral ureters and urinary bladder appear unremarkable. Stomach/Bowel: Stomach is within normal limits. Appendix appears normal. No evidence of bowel wall thickening, distention, or inflammatory changes. Lymphatic: No abdominopelvic lymphadenopathy. Reproductive: Prostate is unremarkable. Other: No free air, free fluid, or abdominal wall hernia. Musculoskeletal: Degenerative disc disease of L5-S1. No acute or significant osseous findings. Review of the MIP images confirms the above findings. IMPRESSION: 1. No evidence of aortic aneurysm or dissection. 2. No acute findings within the chest, abdomen or pelvis. 3. Multiple right-sided thyroid nodules, largest measuring up to 13 mm. Further  evaluation with nonemergent thyroid ultrasound is recommended. (ref: J Am Coll Radiol. 2015 Feb;12(2): 143-50). 4. Aortic atherosclerosis. (ICD10-I70.0). 5. Absence of the right pectoralis musculature, which may be postsurgical or related to [place] syndrome. Electronically Signed   By: Duanne Guess D.O.   On: 07/03/2019 10:05     Assessment/Plan  Chest pain in the setting of cocaine use admitted with chest pain found to have positive troponin but after cocaine use.  Initial troponin in 400s>700s>2143, seen by cardiology and is started on heparin drip, aspirin 81 mg , Lipitor and prn NTG.  Unable to use beta-blocker due to cocaine use.  Cardiology on board for possible echocardiogram and ischemic evaluation.  Lipid panel shows LDL at 84 HDL in 50s stable.  Elevated blood pressure suspect unmanaged hypertension immobility and was elevated will start amlodipine 5 mg about beta-blocker.  Mixed hyperlipidemia lipid panel fairly stable.  Started on 20 mg Lipitor.  Cocaine use: Discussed about cessation.  UDS was positive for THC and cocaine  Hypokalemia:replete.  Etoh abuse: Thiamine folate alcohol cessation advised.  Add CIWA protocol  GERD: Continue PPI  Prediabetes 5.7 Dietary education.  Incidentally noted thyroid nodules " Multiple right-sided thyroid nodules, largest measuring up to 13 mm. Further evaluation with nonemergent thyroid ultrasound is Recommended" we will order thyroid ultrasound  Body mass index is 22.38 kg/m.   Severity of Illness: * I certify that at the point of admission it is my  clinical judgment that the patient will require inpatient hospital care spanning beyond 2 midnights from the point of admission due to high intensity of service, high risk for further deterioration and high frequency of surveillance required due to NSTEMI   DVT prophylaxis:heparin Code Status: Full code Family Communication: Admission, patients condition and plan of care including tests  being ordered have been discussed with the patient who indicate understanding and agree with the plan and Code Status.  Consults called:   Lanae Boast MD [facility]  If 7PM-7AM, please contact night-coverage [email or web address]  07/03/2019, 12:19 PM

## 2019-07-04 ENCOUNTER — Inpatient Hospital Stay (HOSPITAL_COMMUNITY): Payer: Self-pay

## 2019-07-04 DIAGNOSIS — E785 Hyperlipidemia, unspecified: Secondary | ICD-10-CM

## 2019-07-04 DIAGNOSIS — F1490 Cocaine use, unspecified, uncomplicated: Secondary | ICD-10-CM

## 2019-07-04 DIAGNOSIS — E041 Nontoxic single thyroid nodule: Secondary | ICD-10-CM

## 2019-07-04 LAB — TROPONIN I (HIGH SENSITIVITY)
Troponin I (High Sensitivity): 10324 ng/L (ref <18)
Troponin I (High Sensitivity): 11980 ng/L (ref <18)
Troponin I (High Sensitivity): 11928 ng/L (ref <18)

## 2019-07-04 LAB — BASIC METABOLIC PANEL
Anion gap: 8 (ref 5–15)
BUN: 11 mg/dL (ref 6–20)
CO2: 23 mmol/L (ref 22–32)
Calcium: 8.9 mg/dL (ref 8.9–10.3)
Chloride: 105 mmol/L (ref 98–111)
Creatinine, Ser: 0.88 mg/dL (ref 0.61–1.24)
GFR calc Af Amer: >60 mL/min (ref >60)
GFR calc non Af Amer: >60 mL/min (ref >60)
Glucose, Bld: 94 mg/dL (ref 70–99)
Potassium: 3.7 mmol/L (ref 3.5–5.1)
Sodium: 136 mmol/L (ref 135–145)

## 2019-07-04 LAB — CBC
HCT: 33.8 % — ABNORMAL LOW (ref 39.0–52.0)
Hemoglobin: 11.3 g/dL — ABNORMAL LOW (ref 13.0–17.0)
MCH: 33.1 pg (ref 26.0–34.0)
MCHC: 33.4 g/dL (ref 30.0–36.0)
MCV: 99.1 fL (ref 80.0–100.0)
Platelets: 290 10*3/uL (ref 150–400)
RBC: 3.41 MIL/uL — ABNORMAL LOW (ref 4.22–5.81)
RDW: 15.1 % (ref 11.5–15.5)
WBC: 13.9 10*3/uL — ABNORMAL HIGH (ref 4.0–10.5)
nRBC: 0 % (ref 0.0–0.2)

## 2019-07-04 LAB — HIV ANTIBODY (ROUTINE TESTING W REFLEX): HIV Screen 4th Generation wRfx: NONREACTIVE

## 2019-07-04 LAB — HEPARIN LEVEL (UNFRACTIONATED)
Heparin Unfractionated: 0.34 IU/mL (ref 0.30–0.70)
Heparin Unfractionated: 0.39 IU/mL (ref 0.30–0.70)

## 2019-07-04 MED ORDER — HEPARIN (PORCINE) 25000 UT/250ML-% IV SOLN
1450.0000 [IU]/h | INTRAVENOUS | Status: DC
  Administered 2019-07-04 – 2019-07-06 (×3): 1450 [IU]/h via INTRAVENOUS
  Filled 2019-07-04 (×3): qty 250

## 2019-07-04 NOTE — Plan of Care (Signed)

## 2019-07-04 NOTE — Progress Notes (Signed)
ANTICOAGULATION CONSULT NOTE   Pharmacy Consult for Heparin Indication: chest pain/ACS  Allergies  Allergen Reactions  . Penicillins Anaphylaxis and Hives    Has patient had a PCN reaction causing immediate rash, facial/tongue/throat swelling, SOB or lightheadedness with hypotension: Yes Has patient had a PCN reaction causing severe rash involving mucus membranes or skin necrosis: Yes Has patient had a PCN reaction that required hospitalization: No Has patient had a PCN reaction occurring within the last 10 years: Yes If all of the above answers are "NO", then may proceed with Cephalosporin use.   Marland Kitchen Amoxicillin Hives   Patient Measurements: Height: 6' (182.9 cm) Weight: 75.6 kg (166 lb 10.7 oz) IBW/kg (Calculated) : 77.6 Heparin Dosing Weight: actual weight  Vital Signs: Temp: 99 F (37.2 C) (05/15 0400) Temp Source: Oral (05/15 0400) BP: 96/71 (05/15 0700) Pulse Rate: 39 (05/15 0700)  Labs: Recent Labs    07/03/19 0550 07/03/19 0734 07/03/19 1035 07/03/19 1218 07/03/19 1700 07/03/19 1845 07/03/19 2255 07/04/19 0609  HGB 11.9*  --   --   --   --   --   --  11.3*  HCT 35.2*  --   --   --   --   --   --  33.8*  PLT 331  --   --   --   --   --   --  290  APTT  --   --  25  --   --   --   --   --   LABPROT  --   --  13.6  --   --   --   --   --   INR  --   --  1.1  --   --   --   --   --   HEPARINUNFRC  --   --   --   --  0.38  --  0.18* 0.34  CREATININE 0.79  --   --   --   --   --   --  0.88  TROPONINIHS 432*   < >  --    < >  --  5,873* 10,324* 11,980*   < > = values in this interval not displayed.   Estimated Creatinine Clearance: 103.8 mL/min (by C-G formula based on SCr of 0.88 mg/dL).  Medical History: Past Medical History:  Diagnosis Date  . GERD (gastroesophageal reflux disease)   . Mixed hyperlipidemia     Medications:  Medications Prior to Admission  Medication Sig Dispense Refill Last Dose  . Esomeprazole Magnesium (NEXIUM 24HR) 20 MG TBEC Take  20 mg by mouth daily.   07/02/2019 at Unknown time  . naproxen sodium (ALEVE) 220 MG tablet Take 440 mg by mouth 2 (two) times daily as needed (back pain).   Past Month at Unknown time  . tetrahydrozoline-zinc (VISINE-AC) 0.05-0.25 % ophthalmic solution Place 2 drops into both eyes 3 (three) times daily as needed (red eyes dry eye).   Past Week at Unknown time  . erythromycin ophthalmic ointment Place a 1/2 inch ribbon of ointment into the lower eyelid of the left eye 4 times a day for 5 days (Patient not taking: Reported on 10/24/2017) 3.5 g 0   . omeprazole (PRILOSEC) 20 MG capsule Take 1 capsule (20 mg total) by mouth daily for 14 days. 14 capsule 1    Assessment: 54 y/o M admitted with CP after using cocaine. CT chest negative for acute findings.EKG without evidence of ischemia. Troponin  elevated. Cards consulted. Possible Cardiac Cath 5/17, requested tx to [facility] Intially started Heparin with 4000 unit load, 1050 units/hr > first level therapeutic 0.38 on 1050 units/hr 2nd Hep level 0.18 - low > rebolus 2000 units, incr to 1300 units/hr  Today, 07/04/2019  Goal of Therapy:  Heparin level 0.3-0.7 units/ml Monitor platelets by anticoagulation protocol: Yes   Plan:   Increase Heparin rate to 1450 units/hr  Recheck Hep level at 1500  Daily heparin level and CBC  Monitor for signs/symptoms of bleeding  Minda Ditto PharmD [facility] [phone] 07/04/2019, 8:21 AM

## 2019-07-04 NOTE — Progress Notes (Addendum)
PROGRESS NOTE  Ryan Owen TKP:[id number] DOB: 04-14-1965 DOA: 07/03/2019 PCP: Patient, No Pcp Per   LOS: 1 day   Brief Narrative / Interim history: 54 year old male with history of GERD, HLD, tobacco, alcohol, cocaine abuse comes to the hospital with chief complaint of chest pain that started after using cocaine the day prior to admission.  Pain was substernal, sharp in nature and nonradiating.  He tried some Tums and antacids at home, did not help and then came to the ED.  He had no nausea, vomiting, no shortness of breath.  He was found to have significant elevation of his high-sensitivity troponin for which cardiology was consulted, and recommended transfer to [facility] for cardiac catheterization.  Subjective / 24h Interval events: Denies any chest pain this morning, denies any shortness of breath.  No abdominal pain, nausea or vomiting.  Assessment & Plan: Principal Problem Non-STEMI-in the setting of cocaine use however he also has some evidence of coronary calcifications on chest imaging. Patient's troponin significantly elevated close to 12,000 this morning.  He has been placed on heparin infusion by cardiology, continue.  Recommendations are for cardiac catheterization on Monday or emergently if he is to develop chest pain again.  Continue aspirin, statin.  Reassuringly, his 2D echo did not show any wall motion abnormalities.  Active Problems Essential hypertension-blood pressure elevated on admission but now normalized.  Continue to monitor  Hyperlipidemia-continue statin  Polysubstance abuse-recommended cessation.  Continue CIWA, does not appear to be withdrawing  Incidental thyroid nodule-ultrasound of the thyroid shows a solitary right-sided nodule meeting criteria for FNA.  Will need outpatient work-up by endocrinology.    Scheduled Meds: . aspirin EC  81 mg Oral Daily  . atorvastatin  20 mg Oral Daily  . Chlorhexidine Gluconate Cloth  6 each Topical Daily    . folic acid  1 mg Oral Daily  . mouth rinse  15 mL Mouth Rinse BID  . multivitamin with minerals  1 tablet Oral Daily  . thiamine  100 mg Oral Daily   Or  . thiamine  100 mg Intravenous Daily   Continuous Infusions: . heparin 1,450 Units/hr (07/04/19 0831)   PRN Meds:.acetaminophen, LORazepam **OR** LORazepam, nitroGLYCERIN, ondansetron (ZOFRAN) IV  DVT prophylaxis: heparin Code Status: Full code Family Communication: d/w patient   Status is: Inpatient  Remains inpatient appropriate because:Inpatient level of care appropriate due to severity of illness   Dispo: The patient is from: Home              Anticipated d/c is to: Home              Anticipated d/c date is: 2 days              Patient currently is not medically stable to d/c.   Consultants:  Cardiology   Procedures:  2D echo:  IMPRESSIONS  1. Left ventricular ejection fraction, by estimation, is 60 to 65%. The left ventricle has normal function. The left ventricle has no regional wall motion abnormalities. Left ventricular diastolic parameters were normal.  2. Right ventricular systolic function is normal. The right ventricular size is normal. Tricuspid regurgitation signal is inadequate for assessing PA pressure.  3. Left atrial size was mildly dilated.  4. The mitral valve is normal in structure. Trivial mitral valve regurgitation. No evidence of mitral stenosis.  5. The aortic valve is tricuspid. Aortic valve regurgitation is not visualized. Mild aortic valve sclerosis is present, with no evidence of aortic valve stenosis.  6. The inferior vena cava is normal in size with greater than 50% respiratory variability, suggesting right atrial pressure of 3 mmHg.  Microbiology  None   Antimicrobials: None     Objective: Vitals:   07/04/19 0700 07/04/19 0800 07/04/19 0900 07/04/19 0928  BP: 96/71 108/74 115/85 135/84  Pulse: (!) 39 (!) 46 (!) 50 (!) 54  Resp: 14 15 11 15   Temp:  98 F (36.7 C)     TempSrc:  Oral    SpO2:  97% 100% 99%  Weight:      Height:        Intake/Output Summary (Last 24 hours) at 07/04/2019 1023 Last data filed at 07/04/2019 0838 Gross per 24 hour  Intake 163.14 ml  Output 650 ml  Net -486.86 ml   Filed Weights   07/03/19 0754 07/03/19 1830  Weight: 74.8 kg 75.6 kg    Examination:  Constitutional: NAD Eyes: no scleral icterus ENMT: Mucous membranes are moist.  Neck: normal, supple Respiratory: clear to auscultation bilaterally, no wheezing, no crackles. Normal respiratory effort. No accessory muscle use.  Cardiovascular: Regular rate and rhythm, no murmurs / rubs / gallops. No LE edema. Good peripheral pulses Abdomen: non distended, no tenderness. Bowel sounds positive.  Musculoskeletal: no clubbing / cyanosis.  Skin: no rashes Neurologic: CN 2-12 grossly intact. Strength 5/5 in all 4.    Data Reviewed: I have independently reviewed following labs and imaging studies   CBC: Recent Labs  Lab 07/03/19 0550 07/04/19 0609  WBC 9.2 13.9*  HGB 11.9* 11.3*  HCT 35.2* 33.8*  MCV 99.4 99.1  PLT 331 290   Basic Metabolic Panel: Recent Labs  Lab 07/03/19 0550 07/03/19 1250 07/04/19 0609  NA 139  --  136  K 3.3*  --  3.7  CL 107  --  105  CO2 23  --  23  GLUCOSE 91  --  94  BUN 7  --  11  CREATININE 0.79  --  0.88  CALCIUM 8.4*  --  8.9  MG  --  1.9  --   PHOS  --  3.8  --    Liver Function Tests: No results for input(s): AST, ALT, ALKPHOS, BILITOT, PROT, ALBUMIN in the last 168 hours. Coagulation Profile: Recent Labs  Lab 07/03/19 1035  INR 1.1   HbA1C: Recent Labs    07/03/19 0550  HGBA1C 5.7*   CBG: No results for input(s): GLUCAP in the last 168 hours.  Recent Results (from the past 240 hour(s))  SARS Coronavirus 2 by RT PCR (hospital order, performed in [facility] hospital lab) Nasopharyngeal Nasopharyngeal Swab     Status: None   Collection Time: 07/03/19  7:15 AM   Specimen: Nasopharyngeal Swab  Result Value  Ref Range Status   SARS Coronavirus 2 NEGATIVE NEGATIVE Final    Comment: (NOTE) SARS-CoV-2 target nucleic acids are NOT DETECTED. The SARS-CoV-2 RNA is generally detectable in upper and lower respiratory specimens during the acute phase of infection. The lowest concentration of SARS-CoV-2 viral copies this assay can detect is 250 copies / mL. A negative result does not preclude SARS-CoV-2 infection and should not be used as the sole basis for treatment or other patient management decisions.  A negative result may occur with improper specimen collection / handling, submission of specimen other than nasopharyngeal swab, presence of viral mutation(s) within the areas targeted by this assay, and inadequate number of viral copies (<250 copies / mL). A negative result must be combined  with clinical observations, patient history, and epidemiological information. Fact Sheet for Patients:   BoilerBrush.com.cy Fact Sheet for Healthcare Providers: https://pope.com/ This test is not yet approved or cleared  by the [place] FDA and has been authorized for detection and/or diagnosis of SARS-CoV-2 by FDA under an Emergency Use Authorization (EUA).  This EUA will remain in effect (meaning this test can be used) for the duration of the COVID-19 declaration under Section 564(b)(1) of the Act, 21 U.S.C. section 360bbb-3(b)(1), unless the authorization is terminated or revoked sooner. Performed at [facility], [address] [place], [place] [address]   MRSA PCR Screening     Status: None   Collection Time: 07/03/19  6:37 PM   Specimen: Nasal Mucosa; Nasopharyngeal  Result Value Ref Range Status   MRSA by PCR NEGATIVE NEGATIVE Final    Comment:        The GeneXpert MRSA Assay (FDA approved for NASAL specimens only), is one component of a comprehensive MRSA colonization surveillance program. It is not intended to diagnose  MRSA infection nor to guide or monitor treatment for MRSA infections. Performed at [facility], [address] [place], [place] [address]      Radiology Studies: ECHOCARDIOGRAM COMPLETE  Result Date: 07/03/2019    ECHOCARDIOGRAM REPORT   Patient Name:   Ryan Owen Date of Exam: 07/03/2019 Medical Rec #:  [id number]       Height:       72.0 in Accession #:    [id number]      Weight:       165.0 lb Date of Birth:  12-26-1965       BSA:          1.963 m Patient Age:    53 years        BP:           134/86 mmHg Patient Gender: M               HR:           55 bpm. Exam Location:  Inpatient Procedure: 2D Echo Indications:   Chest Pain 786.50 / R07.9  History:       Patient has no prior history of Echocardiogram examinations.                NSTEMI.  Sonographer:   Leeroy Bock Turrentine Referring      [id number] Beatriz Stallion Phys: IMPRESSIONS  1. Left ventricular ejection fraction, by estimation, is 60 to 65%. The left ventricle has normal function. The left ventricle has no regional wall motion abnormalities. Left ventricular diastolic parameters were normal.  2. Right ventricular systolic function is normal. The right ventricular size is normal. Tricuspid regurgitation signal is inadequate for assessing PA pressure.  3. Left atrial size was mildly dilated.  4. The mitral valve is normal in structure. Trivial mitral valve regurgitation. No evidence of mitral stenosis.  5. The aortic valve is tricuspid. Aortic valve regurgitation is not visualized. Mild aortic valve sclerosis is present, with no evidence of aortic valve stenosis.  6. The inferior vena cava is normal in size with greater than 50% respiratory variability, suggesting right atrial pressure of 3 mmHg. FINDINGS  Left Ventricle: Left ventricular ejection fraction, by estimation, is 60 to 65%. The left ventricle has normal function. The left ventricle has no regional wall motion abnormalities. The left ventricular internal cavity  size was normal in size. There is  no left ventricular hypertrophy. Left ventricular  diastolic parameters were normal. Normal left ventricular filling pressure. Right Ventricle: The right ventricular size is normal. No increase in right ventricular wall thickness. Right ventricular systolic function is normal. Tricuspid regurgitation signal is inadequate for assessing PA pressure. Left Atrium: Left atrial size was mildly dilated. Right Atrium: Right atrial size was normal in size. Pericardium: There is no evidence of pericardial effusion. Mitral Valve: The mitral valve is normal in structure. Normal mobility of the mitral valve leaflets. Moderate mitral annular calcification. Trivial mitral valve regurgitation. No evidence of mitral valve stenosis. Tricuspid Valve: The tricuspid valve is normal in structure. Tricuspid valve regurgitation is mild . No evidence of tricuspid stenosis. Aortic Valve: The aortic valve is tricuspid. Aortic valve regurgitation is not visualized. Mild aortic valve sclerosis is present, with no evidence of aortic valve stenosis. Pulmonic Valve: The pulmonic valve was normal in structure. Pulmonic valve regurgitation is not visualized. No evidence of pulmonic stenosis. Aorta: The aortic root is normal in size and structure. Venous: The inferior vena cava is normal in size with greater than 50% respiratory variability, suggesting right atrial pressure of 3 mmHg. IAS/Shunts: No atrial level shunt detected by color flow Doppler.  LEFT VENTRICLE PLAX 2D LVIDd:         5.20 cm  Diastology LVIDs:         3.90 cm  LV e' lateral:   11.20 cm/s LV PW:         0.90 cm  LV E/e' lateral: 7.8 LV IVS:        0.90 cm  LV e' medial:    10.20 cm/s LVOT diam:     2.10 cm  LV E/e' medial:  8.6 LV SV:         82 LV SV Index:   42 LVOT Area:     3.46 cm  RIGHT VENTRICLE RV S prime:     11.70 cm/s TAPSE (M-mode): 2.8 cm LEFT ATRIUM             Index       RIGHT ATRIUM           Index LA diam:        4.25 cm 2.16  cm/m  RA Area:     18.20 cm LA Vol (A2C):   71.1 ml 36.21 ml/m RA Volume:   52.00 ml  26.49 ml/m LA Vol (A4C):   64.7 ml 32.95 ml/m LA Biplane Vol: 70.5 ml 35.91 ml/m  AORTIC VALVE LVOT Vmax:   111.00 cm/s LVOT Vmean:  76.900 cm/s LVOT VTI:    0.238 m  AORTA Ao Root diam: 3.20 cm MITRAL VALVE MV Area (PHT): 4.31 cm    SHUNTS MV Decel Time: 176 msec    Systemic VTI:  0.24 m MV E velocity: 87.40 cm/s  Systemic Diam: 2.10 cm MV A velocity: 76.70 cm/s MV E/A ratio:  1.14 Armanda Magic MD Electronically signed by Armanda Magic MD Signature Date/Time: 07/03/2019/1:54:27 PM    Final    US THYROID  Result Date: 07/04/2019 CLINICAL DATA:  Incidental on CT. Right-sided thyroid nodule demonstrated on chest CT performed 07/03/2019. EXAM: THYROID ULTRASOUND TECHNIQUE: Ultrasound examination of the thyroid gland and adjacent soft tissues was performed. COMPARISON:  Chest CT-07/03/2019 FINDINGS: Parenchymal Echotexture: Normal Isthmus: Normal in size measuring 0.9 cm in diameter Right lobe: Borderline enlarged measuring 5.9 x 2.3 x 2.5 cm Left lobe: Normal in size measuring 5.6 x 1.7 x 2.5 cm _________________________________________________________ Estimated total number of nodules >/= 1 cm:  1 Number of spongiform nodules >/=  2 cm not described below (TR1): 0 Number of mixed cystic and solid nodules >/= 1.5 cm not described below (TR2): 0 _________________________________________________________ Nodule # 1: Location: Right; Mid Maximum size: 1.6 cm; Other 2 dimensions: 1.1 x 1.0 cm Composition: solid/almost completely solid (2) Echogenicity: isoechoic (1) Shape: not taller-than-wide (0) Margins: lobulated/irregular (2) Echogenic foci: none (0) ACR TI-RADS total points: 5. ACR TI-RADS risk category: TR4 (4-6 points). ACR TI-RADS recommendations: **Given size (>/= 1.5 cm) and appearance, fine needle aspiration of this moderately suspicious nodule should be considered based on TI-RADS criteria.  _________________________________________________________ IMPRESSION: Solitary right-sided thyroid nodule meets imaging criteria to recommend percutaneous sampling as indicated. The above is in keeping with the ACR TI-RADS recommendations - J Am Coll Radiol 2017;14:587-595. Electronically Signed   By: John  WattsSimonne Come M.D.   On: 07/04/2019 09:37    Pamella Pertostin Bradshaw Minihan, MD, PhD [facility]  Between 7 am - 7 pm I am available, please contact me via Amion or Securechat  Between 7 pm - 7 am I am not available, please contact night coverage MD/APP via Amion

## 2019-07-04 NOTE — Progress Notes (Signed)
Progress Note  Patient Name: Ryan Owen Date of Encounter: 07/04/2019  Primary Cardiologist: No primary care provider on file.   Subjective   Denies any chest pain.  High-sensitivity troponin up to 10,324.  BP 96/71 this morning.  Inpatient Medications    Scheduled Meds: . aspirin EC  81 mg Oral Daily  . atorvastatin  20 mg Oral Daily  . Chlorhexidine Gluconate Cloth  6 each Topical Daily  . folic acid  1 mg Oral Daily  . mouth rinse  15 mL Mouth Rinse BID  . multivitamin with minerals  1 tablet Oral Daily  . thiamine  100 mg Oral Daily   Or  . thiamine  100 mg Intravenous Daily   Continuous Infusions: . heparin 1,300 Units/hr (07/04/19 0358)   PRN Meds: acetaminophen, LORazepam **OR** LORazepam, nitroGLYCERIN, ondansetron (ZOFRAN) IV   Vital Signs    Vitals:   07/04/19 0400 07/04/19 0417 07/04/19 0509 07/04/19 0600  BP:  106/72 120/76 (!) 158/76  Pulse:  (!) 49 (!) 52 67  Resp:  15 18 19   Temp: 99 F (37.2 C)     TempSrc: Oral     SpO2:  93% 99% 95%  Weight:      Height:        Intake/Output Summary (Last 24 hours) at 07/04/2019 0723 Last data filed at 07/04/2019 0358 Gross per 24 hour  Intake 104.08 ml  Output 400 ml  Net -295.92 ml   Last 3 Weights 07/03/2019 07/03/2019 10/24/2017  Weight (lbs) 166 lb 10.7 oz 165 lb 165 lb  Weight (kg) 75.6 kg 74.844 kg 74.844 kg      Telemetry    Sinus bradycardia, rate 40-60s.  5 beat run of NSVT - Personally Reviewed  ECG    Sinus bradycardia, rate 49, no ST abnormalities- Personally Reviewed  Physical Exam   GEN: No acute distress.   Neck: No JVD Cardiac: bradycardia, no murmurs, rubs, or gallops.  Respiratory: Clear to auscultation bilaterally. GI: Soft, nontender, non-distended  MS: No edema; No deformity. Neuro:  Nonfocal  Psych: Normal affect   Labs    High Sensitivity Troponin:   Recent Labs  Lab 07/03/19 0734 07/03/19 1018 07/03/19 1218 07/03/19 1845 07/03/19 2255  TROPONINIHS 765*  2,143* 4,193* 5,873* 10,324*      Chemistry Recent Labs  Lab 07/03/19 0550 07/04/19 0609  NA 139 136  K 3.3* 3.7  CL 107 105  CO2 23 23  GLUCOSE 91 94  BUN 7 11  CREATININE 0.79 0.88  CALCIUM 8.4* 8.9  GFRNONAA >60 >60  GFRAA >60 >60  ANIONGAP 9 8     Hematology Recent Labs  Lab 07/03/19 0550 07/04/19 0609  WBC 9.2 13.9*  RBC 3.54* 3.41*  HGB 11.9* 11.3*  HCT 35.2* 33.8*  MCV 99.4 99.1  MCH 33.6 33.1  MCHC 33.8 33.4  RDW 15.1 15.1  PLT 331 290    BNPNo results for input(s): BNP, PROBNP in the last 168 hours.   DDimer No results for input(s): DDIMER in the last 168 hours.   Radiology    DG Chest 2 View  Result Date: 07/03/2019 CLINICAL DATA:  Chest pain and shortness of breath EXAM: CHEST - 2 VIEW COMPARISON:  None. FINDINGS: Normal heart size and mediastinal contours. No acute infiltrate or edema. No effusion or pneumothorax. No acute osseous findings. IMPRESSION: No active cardiopulmonary disease. Electronically Signed   By: Monte Fantasia M.D.   On: 07/03/2019 06:15   ECHOCARDIOGRAM COMPLETE  Result Date: 07/03/2019    ECHOCARDIOGRAM REPORT   Patient Name:   Ryan Owen Date of Exam: 07/03/2019 Medical Rec #:  [id number]       Height:       72.0 in Accession #:    [id number]      Weight:       165.0 lb Date of Birth:  [birth year]       BSA:          1.963 m Patient Age:    53 years        BP:           134/86 mmHg Patient Gender: M               HR:           55 bpm. Exam Location:  Inpatient Procedure: 2D Echo Indications:   Chest Pain 786.50 / R07.9  History:       Patient has no prior history of Echocardiogram examinations.                NSTEMI.  Sonographer:   Leeroy Bock Turrentine Referring      [id number] Beatriz Stallion Phys: IMPRESSIONS  1. Left ventricular ejection fraction, by estimation, is 60 to 65%. The left ventricle has normal function. The left ventricle has no regional wall motion abnormalities. Left ventricular diastolic parameters were normal.  2.  Right ventricular systolic function is normal. The right ventricular size is normal. Tricuspid regurgitation signal is inadequate for assessing PA pressure.  3. Left atrial size was mildly dilated.  4. The mitral valve is normal in structure. Trivial mitral valve regurgitation. No evidence of mitral stenosis.  5. The aortic valve is tricuspid. Aortic valve regurgitation is not visualized. Mild aortic valve sclerosis is present, with no evidence of aortic valve stenosis.  6. The inferior vena cava is normal in size with greater than 50% respiratory variability, suggesting right atrial pressure of 3 mmHg. FINDINGS  Left Ventricle: Left ventricular ejection fraction, by estimation, is 60 to 65%. The left ventricle has normal function. The left ventricle has no regional wall motion abnormalities. The left ventricular internal cavity size was normal in size. There is  no left ventricular hypertrophy. Left ventricular diastolic parameters were normal. Normal left ventricular filling pressure. Right Ventricle: The right ventricular size is normal. No increase in right ventricular wall thickness. Right ventricular systolic function is normal. Tricuspid regurgitation signal is inadequate for assessing PA pressure. Left Atrium: Left atrial size was mildly dilated. Right Atrium: Right atrial size was normal in size. Pericardium: There is no evidence of pericardial effusion. Mitral Valve: The mitral valve is normal in structure. Normal mobility of the mitral valve leaflets. Moderate mitral annular calcification. Trivial mitral valve regurgitation. No evidence of mitral valve stenosis. Tricuspid Valve: The tricuspid valve is normal in structure. Tricuspid valve regurgitation is mild . No evidence of tricuspid stenosis. Aortic Valve: The aortic valve is tricuspid. Aortic valve regurgitation is not visualized. Mild aortic valve sclerosis is present, with no evidence of aortic valve stenosis. Pulmonic Valve: The pulmonic valve was  normal in structure. Pulmonic valve regurgitation is not visualized. No evidence of pulmonic stenosis. Aorta: The aortic root is normal in size and structure. Venous: The inferior vena cava is normal in size with greater than 50% respiratory variability, suggesting right atrial pressure of 3 mmHg. IAS/Shunts: No atrial level shunt detected by color flow Doppler.  LEFT VENTRICLE PLAX 2D LVIDd:  5.20 cm  Diastology LVIDs:         3.90 cm  LV e' lateral:   11.20 cm/s LV PW:         0.90 cm  LV E/e' lateral: 7.8 LV IVS:        0.90 cm  LV e' medial:    10.20 cm/s LVOT diam:     2.10 cm  LV E/e' medial:  8.6 LV SV:         82 LV SV Index:   42 LVOT Area:     3.46 cm  RIGHT VENTRICLE RV S prime:     11.70 cm/s TAPSE (M-mode): 2.8 cm LEFT ATRIUM             Index       RIGHT ATRIUM           Index LA diam:        4.25 cm 2.16 cm/m  RA Area:     18.20 cm LA Vol (A2C):   71.1 ml 36.21 ml/m RA Volume:   52.00 ml  26.49 ml/m LA Vol (A4C):   64.7 ml 32.95 ml/m LA Biplane Vol: 70.5 ml 35.91 ml/m  AORTIC VALVE LVOT Vmax:   111.00 cm/s LVOT Vmean:  76.900 cm/s LVOT VTI:    0.238 m  AORTA Ao Root diam: 3.20 cm MITRAL VALVE MV Area (PHT): 4.31 cm    SHUNTS MV Decel Time: 176 msec    Systemic VTI:  0.24 m MV E velocity: 87.40 cm/s  Systemic Diam: 2.10 cm MV A velocity: 76.70 cm/s MV E/A ratio:  1.14 Armanda Magicraci Turner MD Electronically signed by Armanda Magicraci Turner MD Signature Date/Time: 07/03/2019/1:54:27 PM    Final    CT Angio Chest/Abd/Pel for Dissection W and/or Wo Contrast  Result Date: 07/03/2019 CLINICAL DATA:  Chest pain EXAM: CT ANGIOGRAPHY CHEST, ABDOMEN AND PELVIS TECHNIQUE: Non-contrast CT of the chest was initially obtained. Multidetector CT imaging through the chest, abdomen and pelvis was performed using the standard protocol during bolus administration of intravenous contrast. Multiplanar reconstructed images and MIPs were obtained and reviewed to evaluate the vascular anatomy. CONTRAST:  100mL OMNIPAQUE  IOHEXOL 350 MG/ML SOLN COMPARISON:  CT abdomen-pelvis 01/02/2007, same day chest x-ray FINDINGS: CTA CHEST FINDINGS Cardiovascular: Precontrast image reveals no evidence of intramural hematoma. Preferential opacification of the thoracic aorta. No evidence of thoracic aortic aneurysm or dissection. Scattered atherosclerotic calcification of the aorta and coronary arteries. Normal heart size. No pericardial effusion. Main pulmonary trunk is nondilated. Mediastinum/Nodes: Multiple right-sided thyroid nodules, largest measuring up to 13 mm inferiorly. No axillary, mediastinal, or hilar lymphadenopathy. Trachea and esophagus within normal limits. Lungs/Pleura: Small biapical blebs with mild pleuroparenchymal scarring. Minimal bibasilar subsegmental atelectasis. No focal airspace consolidation, pleural effusion, or pneumothorax. Musculoskeletal: Absence of the right pectoralis musculature. No acute or significant osseous findings. Review of the MIP images confirms the above findings. CTA ABDOMEN AND PELVIS FINDINGS VASCULAR Aorta: Normal caliber aorta without aneurysm, dissection, vasculitis or significant stenosis. Minimal scattered atherosclerotic calcification. Celiac: Patent without evidence of aneurysm, dissection, vasculitis or significant stenosis. SMA: Patent without evidence of aneurysm, dissection, vasculitis or significant stenosis. Renals: Single bilateral renal artery. Both renal arteries are patent without evidence of aneurysm, dissection, vasculitis, fibromuscular dysplasia or significant stenosis. IMA: Patent. Inflow: Patent without evidence of aneurysm, dissection, vasculitis or significant stenosis. Scattered atherosclerotic calcification. Veins: No obvious venous abnormality within the limitations of this arterial phase study. Review of the MIP images confirms the above findings. NON-VASCULAR  Hepatobiliary: No focal liver abnormality is seen. No gallstones, gallbladder wall thickening, or biliary  dilatation. Pancreas: Unremarkable. No pancreatic ductal dilatation or surrounding inflammatory changes. Spleen: Unremarkable. Adrenals/Urinary Tract: Normal adrenal glands. Kidneys enhance symmetrically. No focal lesion, stone, or hydronephrosis. Bilateral ureters and urinary bladder appear unremarkable. Stomach/Bowel: Stomach is within normal limits. Appendix appears normal. No evidence of bowel wall thickening, distention, or inflammatory changes. Lymphatic: No abdominopelvic lymphadenopathy. Reproductive: Prostate is unremarkable. Other: No free air, free fluid, or abdominal wall hernia. Musculoskeletal: Degenerative disc disease of L5-S1. No acute or significant osseous findings. Review of the MIP images confirms the above findings. IMPRESSION: 1. No evidence of aortic aneurysm or dissection. 2. No acute findings within the chest, abdomen or pelvis. 3. Multiple right-sided thyroid nodules, largest measuring up to 13 mm. Further evaluation with nonemergent thyroid ultrasound is recommended. (ref: J Am Coll Radiol. 2015 Feb;12(2): 143-50). 4. Aortic atherosclerosis. (ICD10-I70.0). 5. Absence of the right pectoralis musculature, which may be postsurgical or related to [place] syndrome. Electronically Signed   By: Duanne Guess D.O.   On: 07/03/2019 10:05    Cardiac Studies   TTE 07/03/19: 1. Left ventricular ejection fraction, by estimation, is 60 to 65%. The  left ventricle has normal function. The left ventricle has no regional  wall motion abnormalities. Left ventricular diastolic parameters were  normal.  2. Right ventricular systolic function is normal. The right ventricular  size is normal. Tricuspid regurgitation signal is inadequate for assessing  PA pressure.  3. Left atrial size was mildly dilated.  4. The mitral valve is normal in structure. Trivial mitral valve  regurgitation. No evidence of mitral stenosis.  5. The aortic valve is tricuspid. Aortic valve regurgitation is not    visualized. Mild aortic valve sclerosis is present, with no evidence of  aortic valve stenosis.  6. The inferior vena cava is normal in size with greater than 50%  respiratory variability, suggesting right atrial pressure of 3 mmHg.   Patient Profile     54 y.o. male  with a a PMH of GERD, cocaine abuse, and tobacco abuse  who presents with NSTEMI  Assessment & Plan    1.  NSTEMI: patient presented with chest pain following cocaine use.  Currently chest pain-free.  Marked troponin elevation, up to 10,324.  TTE showed normal LV systolic function.  CTA C/A/P was without aortic aneurysm or dissection.  No known CAD history, though noted to have calcifications of coronary arteries on CTA C/A/P today. EKG shows no ischemic changes.  Risk factors for CAD include likely unmanaged HTN, hypertriglyceridemia, pre-diabetes, and tobacco abuse.   Utox positive for cocaine and THC -Continue aspirin 81 mg daily -Continue heparin drip -Continue atorvastatin -Plan for left heart catheterization on Monday, provided he does not develop unremitting chest pain, hemodynamic instability, or ventricular arrhythmias that would necessitate taking to lab sooner.  Risks and benefits of cardiac catheterization have been discussed with the patient.  These include bleeding, infection, kidney damage, stroke, heart attack, death.  The patient understands these risks and is willing to proceed.  2. Elevated BP:  Noted on admission, is now normotensive.  3. Mixed HLD: Triglycerides 276 this admission, LDL 84. He does have coronary artery calcifications on CTA chest so likely has some underlying CAD. Favor aggressive risk factor modifications -Started atorvastatin 20mg  daily  4. Pre-diabetes: A1C 5.7 - Encourage healthy dietary/lifestyle modifications to prevent progression  5. Tobacco abuse: smoking 1/2 ppd. Risks of continued use discussed - Continue to encourage  cessation  6. Cocaine abuse: reports smoking ~once  per week. Risks of continue use discussed - Continue to encourage abstinence  7. ETOH abuse: drinks 2 40oz beers a day. No history of withdrawals. Last drink yesterday evening.  - Would monitor CIWA while admitted - will defer to primary team.   For questions or updates, please contact [facility] Please consult [email or web address] for contact info under        Signed, Little Ishikawa, MD  07/04/2019, 7:23 AM

## 2019-07-04 NOTE — Progress Notes (Signed)
Spoke with Ryan Owen who has attentively alerted me to Ryan Owen status overnight.  Remains without chest pain or shortness of breath.  One single episode of 5-beat NSVT this evening.  Last EKG sinus bradycardia.  Some change in axis inferiorly that is stable throughout night - perhaps limb lead placement with persistent lateral slight ST elevations.  Major concern is marked troponin increase 432 -->2143 --> 5873 -->92426. Reassuring echocardiogram today with preserved LV function, no RWMA.  Without pain, HF, or electrical instability no clear indication for early invasive strategy, but would prefer he be transferred to [facility].  Informed that, per placement, no beds available here.  Spoke with Corrie Dandy with patient placement who informed me he is on list for transfer here.  Instructed nurse to contact me urgently any concerns about worsening status clinically.  Continue aspirin and heparin infusion Plan for ischemia guided strategy for now.  Merlyn Lot MD Cardiology Fellow [facility]

## 2019-07-04 NOTE — Progress Notes (Signed)
CRITICAL VALUE ALERT  Critical Value: Troponin 58592  Date & Time Notied: 07/04/2019 0740  Provider Notified: Dr. Bjorn Pippin with Cardiology  Orders Received/Actions taken: no new orders at this time

## 2019-07-04 NOTE — Progress Notes (Signed)
ANTICOAGULATION CONSULT NOTE   Pharmacy Consult for Heparin Indication: chest pain/ACS  Allergies  Allergen Reactions  . Penicillins Anaphylaxis and Hives    Has patient had a PCN reaction causing immediate rash, facial/tongue/throat swelling, SOB or lightheadedness with hypotension: Yes Has patient had a PCN reaction causing severe rash involving mucus membranes or skin necrosis: Yes Has patient had a PCN reaction that required hospitalization: No Has patient had a PCN reaction occurring within the last 10 years: Yes If all of the above answers are "NO", then may proceed with Cephalosporin use.   Marland Kitchen Amoxicillin Hives   Patient Measurements: Height: 6' (182.9 cm) Weight: 75.4 kg (166 lb 3.6 oz) IBW/kg (Calculated) : 77.6 Heparin Dosing Weight: actual weight  Vital Signs: Temp: 98 F (36.7 C) (05/15 1100) Temp Source: Oral (05/15 1100) BP: 106/66 (05/15 1100) Pulse Rate: 54 (05/15 0928)  Labs: Recent Labs    07/03/19 0550 07/03/19 0734 07/03/19 1035 07/03/19 1218 07/03/19 2255 07/04/19 0609 07/04/19 0945 07/04/19 1458  HGB 11.9*  --   --   --   --  11.3*  --   --   HCT 35.2*  --   --   --   --  33.8*  --   --   PLT 331  --   --   --   --  290  --   --   APTT  --   --  25  --   --   --   --   --   LABPROT  --   --  13.6  --   --   --   --   --   INR  --   --  1.1  --   --   --   --   --   HEPARINUNFRC  --   --   --    < > 0.18* 0.34  --  0.39  CREATININE 0.79  --   --   --   --  0.88  --   --   TROPONINIHS 432*   < >  --    < > 10,324* 11,980* 11,928*  --    < > = values in this interval not displayed.   Estimated Creatinine Clearance: 103.5 mL/min (by C-G formula based on SCr of 0.88 mg/dL).  Medical History: Past Medical History:  Diagnosis Date  . GERD (gastroesophageal reflux disease)   . Mixed hyperlipidemia     Medications:  Medications Prior to Admission  Medication Sig Dispense Refill Last Dose  . Esomeprazole Magnesium (NEXIUM 24HR) 20 MG TBEC Take  20 mg by mouth daily.   07/02/2019 at Unknown time  . naproxen sodium (ALEVE) 220 MG tablet Take 440 mg by mouth 2 (two) times daily as needed (back pain).   Past Month at Unknown time  . tetrahydrozoline-zinc (VISINE-AC) 0.05-0.25 % ophthalmic solution Place 2 drops into both eyes 3 (three) times daily as needed (red eyes dry eye).   Past Week at Unknown time  . erythromycin ophthalmic ointment Place a 1/2 inch ribbon of ointment into the lower eyelid of the left eye 4 times a day for 5 days (Patient not taking: Reported on 10/24/2017) 3.5 g 0   . omeprazole (PRILOSEC) 20 MG capsule Take 1 capsule (20 mg total) by mouth daily for 14 days. 14 capsule 1    Assessment: 54 y/o M admitted with CP after using cocaine. CT chest negative for acute findings.EKG without evidence of ischemia.  Troponin elevated. Cards consulted.   Currently on IV heparin at 1450 units/hr and HL remains therapeutic at 0.39.   Goal of Therapy:  Heparin level 0.3-0.7 units/ml Monitor platelets by anticoagulation protocol: Yes   Plan:   Continue IV heparin at 1450 units/hr   Daily heparin level and CBC  Monitor for signs/symptoms of bleeding  Albertina Parr, PharmD., BCPS, BCCCP Clinical Pharmacist Clinical phone for 07/04/19 until 10pm: [phone] If after 10pm, please refer to [facility] for unit-specific pharmacist

## 2019-07-04 NOTE — Progress Notes (Signed)
Pt received from [facility] [place] [facility] at 11am. Pt brought to the floor in stable condition. Vitals taken. Initial Assessment done. All immediate pertinent needs to patient addressed. Patient Guide given to patient. Important safety instructions relating to hospitalization reviewed with patient. Patient verbalized understanding. IV heparin continue @ 14.5. Will continue to monitor pt.

## 2019-07-04 NOTE — Progress Notes (Signed)
Pt's troponin levels continue to increase and cardiology on call made aware with each reading since 1900 tonight. Pt denies chest pain, is not short of breath. Pt did have a 5 bt run of vtach. Pt was resting with no complaints at the time. Vitals obtained and stable with SB on the monitor. EKGs obtained as ordered and with last result Cardiology on call is requesting that pt be transferred to [facility] at this time. Charge RN made aware of this and [facility] on call notified and have ordered for pt to transfer. Per patient placement there is not a room available tonight. Will continue to monitor.

## 2019-07-05 LAB — CBC
HCT: 33.5 % — ABNORMAL LOW (ref 39.0–52.0)
Hemoglobin: 11.4 g/dL — ABNORMAL LOW (ref 13.0–17.0)
MCH: 32.9 pg (ref 26.0–34.0)
MCHC: 34 g/dL (ref 30.0–36.0)
MCV: 96.8 fL (ref 80.0–100.0)
Platelets: 291 10*3/uL (ref 150–400)
RBC: 3.46 MIL/uL — ABNORMAL LOW (ref 4.22–5.81)
RDW: 14.9 % (ref 11.5–15.5)
WBC: 10.7 10*3/uL — ABNORMAL HIGH (ref 4.0–10.5)
nRBC: 0 % (ref 0.0–0.2)

## 2019-07-05 LAB — BASIC METABOLIC PANEL
Anion gap: 8 (ref 5–15)
BUN: 8 mg/dL (ref 6–20)
CO2: 23 mmol/L (ref 22–32)
Calcium: 8.5 mg/dL — ABNORMAL LOW (ref 8.9–10.3)
Chloride: 107 mmol/L (ref 98–111)
Creatinine, Ser: 0.79 mg/dL (ref 0.61–1.24)
GFR calc Af Amer: >60 mL/min (ref >60)
GFR calc non Af Amer: >60 mL/min (ref >60)
Glucose, Bld: 105 mg/dL — ABNORMAL HIGH (ref 70–99)
Potassium: 3.5 mmol/L (ref 3.5–5.1)
Sodium: 138 mmol/L (ref 135–145)

## 2019-07-05 LAB — HEPARIN LEVEL (UNFRACTIONATED): Heparin Unfractionated: 0.34 IU/mL (ref 0.30–0.70)

## 2019-07-05 MED ORDER — SODIUM CHLORIDE 0.9% FLUSH
3.0000 mL | Freq: Two times a day (BID) | INTRAVENOUS | Status: DC
  Administered 2019-07-05 – 2019-07-06 (×3): 3 mL via INTRAVENOUS

## 2019-07-05 MED ORDER — SODIUM CHLORIDE 0.9 % IV SOLN: 250.0000 mL | INTRAVENOUS | Status: DC | PRN

## 2019-07-05 MED ORDER — SODIUM CHLORIDE 0.9 % WEIGHT BASED INFUSION: 1.0000 mL/kg/h | INTRAVENOUS | Status: DC

## 2019-07-05 MED ORDER — SODIUM CHLORIDE 0.9% FLUSH: 3.0000 mL | INTRAVENOUS | Status: DC | PRN

## 2019-07-05 MED ORDER — ASPIRIN 81 MG PO CHEW
81.0000 mg | CHEWABLE_TABLET | ORAL | Status: AC
  Administered 2019-07-06: 81 mg via ORAL
  Filled 2019-07-05: qty 1

## 2019-07-05 MED ORDER — SODIUM CHLORIDE 0.9 % WEIGHT BASED INFUSION
3.0000 mL/kg/h | INTRAVENOUS | Status: DC
  Administered 2019-07-06: 3 mL/kg/h via INTRAVENOUS

## 2019-07-05 NOTE — Plan of Care (Signed)

## 2019-07-05 NOTE — Progress Notes (Signed)
Patient doing well with independent ambulation in room and to bathroom, reports no chest pain, no shortness of breath, independently performs ADL's and able to care for self well.  Asymptomatic for withdrawals, no ativan needed, CIWA scale not used --has been advised he will be NPO at [facility] 5/16 for cath lab procedure on 5/17--IV site flushed as ordered with no complications

## 2019-07-05 NOTE — Progress Notes (Signed)
PROGRESS NOTE  Ryan Owen  ZOX:[id number] DOB: [birth year] DOA: 07/03/2019 PCP: Patient, No Pcp Per   Brief Narrative: Ryan RenshawMichael Owen is a 54 y.o. male with a history of tobacco use, alcohol and cocaine abuse, HLD and GERD who presented with chest pain after using cocaine. Troponin was elevated >10,000 without STEMI on ECG. Heparin IV was started and [facility] in anticipation of LHC on 5/17.  Assessment & Plan: Active Problems:   NSTEMI (non-ST elevated myocardial infarction) (HCC)  NSTEMI: Precipitated by cocaine use in patient with CAD based on coronary calcifications. No wall motion abnormalities on echocardiogram. Chest pain currently resolved. - LHC planned 5/17 per cardiology - IV heparin - ASA - Statin - Not on beta blocker due to cocaine positivity.  Tobacco use:  - Nicotine patch  Cocaine use: +cocaine and THC on UDS. - Cessation counseling provided  Alcohol abuse: ~80oz beer/day, not currently withdrawing.  - CIWA - Cessation/moderation counseling provided.   HTN:  - Continue medications as ordered.  HLD:  - Continue statin, check LFTs in AM  GERD:  - Continue PPI  Incidental thyroid nodule: Meets criteria for FNA on U/S. Multiple right-sided thyroid nodules, largest measuring up to 13 mm. - Check TSH - Defer work up to outpatient setting.  DVT prophylaxis: IV heparin Code Status: Full Family Communication: None at bedside Disposition Plan:  Status is: Inpatient  Remains inpatient appropriate because:Ongoing diagnostic testing needed not appropriate for outpatient work up   Dispo: The patient is from: Home              Anticipated d/c is to: Home              Anticipated d/c date is: 1 day              Patient currently is not medically stable to d/c.  Consultants:   Cardiology  Procedures:   LHC planned 07/06/2019  Antimicrobials:  None   Subjective: Feels well, denies chest pain, dyspnea, palpitations, other pain or  issues. Eating ok.   Objective: Vitals:   07/04/19 2338 07/05/19 0339 07/05/19 0715 07/05/19 1050  BP: 110/75 124/90 120/84 113/76  Pulse: 61 61 64 65  Resp: 14 14 13 13   Temp: 98 F (36.7 C) 98.2 F (36.8 C) 98.4 F (36.9 C) 98.2 F (36.8 C)  TempSrc: Oral Oral Oral Oral  SpO2: 99% 96% 98% 99%  Weight:      Height:        Intake/Output Summary (Last 24 hours) at 07/05/2019 1106 Last data filed at 07/05/2019 0716 Gross per 24 hour  Intake 1482.52 ml  Output 375 ml  Net 1107.52 ml   Filed Weights   07/03/19 0754 07/03/19 1830 07/04/19 1100  Weight: 74.8 kg 75.6 kg 75.4 kg    Gen: 54 y.o. male in no distress  Pulm: Non-labored breathing room air. Clear to auscultation bilaterally.  CV: Regular rate and rhythm. No murmur, rub, or gallop. No JVD, no pedal edema. GI: Abdomen soft, non-tender, non-distended, with normoactive bowel sounds. No organomegaly or masses felt. Ext: Warm, no deformities Skin: No rashes, lesions or ulcers Neuro: Alert and oriented. No focal neurological deficits. Psych: Judgement and insight appear normal. Mood & affect appropriate.   Data Reviewed: I have personally reviewed following labs and imaging studies  CBC: Recent Labs  Lab 07/03/19 0550 07/04/19 0609 07/05/19 0158  WBC 9.2 13.9* 10.7*  HGB 11.9* 11.3* 11.4*  HCT 35.2* 33.8* 33.5*  MCV 99.4  99.1 96.8  PLT 331 290 291   Basic Metabolic Panel: Recent Labs  Lab 07/03/19 0550 07/03/19 1250 07/04/19 0609 07/05/19 0158  NA 139  --  136 138  K 3.3*  --  3.7 3.5  CL 107  --  105 107  CO2 23  --  23 23  GLUCOSE 91  --  94 105*  BUN 7  --  11 8  CREATININE 0.79  --  0.88 0.79  CALCIUM 8.4*  --  8.9 8.5*  MG  --  1.9  --   --   PHOS  --  3.8  --   --    GFR: Estimated Creatinine Clearance: 113.9 mL/min (by C-G formula based on SCr of 0.79 mg/dL). Liver Function Tests: No results for input(s): AST, ALT, ALKPHOS, BILITOT, PROT, ALBUMIN in the last 168 hours. No results for  input(s): LIPASE, AMYLASE in the last 168 hours. No results for input(s): AMMONIA in the last 168 hours. Coagulation Profile: Recent Labs  Lab 07/03/19 1035  INR 1.1   Cardiac Enzymes: No results for input(s): CKTOTAL, CKMB, CKMBINDEX, TROPONINI in the last 168 hours. BNP (last 3 results) No results for input(s): PROBNP in the last 8760 hours. HbA1C: Recent Labs    07/03/19 0550  HGBA1C 5.7*   CBG: No results for input(s): GLUCAP in the last 168 hours. Lipid Profile: Recent Labs    07/03/19 0734  CHOL 191  HDL 52  LDLCALC 84  TRIG 276*  CHOLHDL 3.7   Thyroid Function Tests: No results for input(s): TSH, T4TOTAL, FREET4, T3FREE, THYROIDAB in the last 72 hours. Anemia Panel: No results for input(s): VITAMINB12, FOLATE, FERRITIN, TIBC, IRON, RETICCTPCT in the last 72 hours. Urine analysis:    Component Value Date/Time   COLORURINE STRAW (A) 10/24/2017 1729   APPEARANCEUR CLEAR 10/24/2017 1729   LABSPEC 1.013 10/24/2017 1729   PHURINE 5.0 10/24/2017 1729   GLUCOSEU NEGATIVE 10/24/2017 1729   HGBUR NEGATIVE 10/24/2017 1729   BILIRUBINUR NEGATIVE 10/24/2017 1729   KETONESUR NEGATIVE 10/24/2017 1729   PROTEINUR NEGATIVE 10/24/2017 1729   NITRITE NEGATIVE 10/24/2017 1729   LEUKOCYTESUR MODERATE (A) 10/24/2017 1729   Recent Results (from the past 240 hour(s))  SARS Coronavirus 2 by RT PCR (hospital order, performed in [facility] hospital lab) Nasopharyngeal Nasopharyngeal Swab     Status: None   Collection Time: 07/03/19  7:15 AM   Specimen: Nasopharyngeal Swab  Result Value Ref Range Status   SARS Coronavirus 2 NEGATIVE NEGATIVE Final    Comment: (NOTE) SARS-CoV-2 target nucleic acids are NOT DETECTED. The SARS-CoV-2 RNA is generally detectable in upper and lower respiratory specimens during the acute phase of infection. The lowest concentration of SARS-CoV-2 viral copies this assay can detect is 250 copies / mL. A negative result does not preclude SARS-CoV-2  infection and should not be used as the sole basis for treatment or other patient management decisions.  A negative result may occur with improper specimen collection / handling, submission of specimen other than nasopharyngeal swab, presence of viral mutation(s) within the areas targeted by this assay, and inadequate number of viral copies (<250 copies / mL). A negative result must be combined with clinical observations, patient history, and epidemiological information. Fact Sheet for Patients:   BoilerBrush.com.cy Fact Sheet for Healthcare Providers: https://pope.com/ This test is not yet approved or cleared  by the [place] FDA and has been authorized for detection and/or diagnosis of SARS-CoV-2 by FDA under an Emergency Use Authorization (  EUA).  This EUA will remain in effect (meaning this test can be used) for the duration of the COVID-19 declaration under Section 564(b)(1) of the Act, 21 U.S.C. section 360bbb-3(b)(1), unless the authorization is terminated or revoked sooner. Performed at [facility], [address], [place], [place] [address]   MRSA PCR Screening     Status: None   Collection Time: 07/03/19  6:37 PM   Specimen: Nasal Mucosa; Nasopharyngeal  Result Value Ref Range Status   MRSA by PCR NEGATIVE NEGATIVE Final    Comment:        The GeneXpert MRSA Assay (FDA approved for NASAL specimens only), is one component of a comprehensive MRSA colonization surveillance program. It is not intended to diagnose MRSA infection nor to guide or monitor treatment for MRSA infections. Performed at [facility], [address], [place], [place] [address]       Radiology Studies: ECHOCARDIOGRAM COMPLETE  Result Date: 07/03/2019    ECHOCARDIOGRAM REPORT   Patient Name:   Ryan Owen Date of Exam: 07/03/2019 Medical Rec #:  [id number]       Height:       72.0 in Accession #:     [id number]      Weight:       165.0 lb Date of Birth:  1965/06/08       BSA:          1.963 m Patient Age:    53 years        BP:           134/86 mmHg Patient Gender: M               HR:           55 bpm. Exam Location:  Inpatient Procedure: 2D Echo Indications:   Chest Pain 786.50 / R07.9  History:       Patient has no prior history of Echocardiogram examinations.                NSTEMI.  Sonographer:   Leeroy Bock Turrentine Referring      [id number] Beatriz Stallion Phys: IMPRESSIONS  1. Left ventricular ejection fraction, by estimation, is 60 to 65%. The left ventricle has normal function. The left ventricle has no regional wall motion abnormalities. Left ventricular diastolic parameters were normal.  2. Right ventricular systolic function is normal. The right ventricular size is normal. Tricuspid regurgitation signal is inadequate for assessing PA pressure.  3. Left atrial size was mildly dilated.  4. The mitral valve is normal in structure. Trivial mitral valve regurgitation. No evidence of mitral stenosis.  5. The aortic valve is tricuspid. Aortic valve regurgitation is not visualized. Mild aortic valve sclerosis is present, with no evidence of aortic valve stenosis.  6. The inferior vena cava is normal in size with greater than 50% respiratory variability, suggesting right atrial pressure of 3 mmHg. FINDINGS  Left Ventricle: Left ventricular ejection fraction, by estimation, is 60 to 65%. The left ventricle has normal function. The left ventricle has no regional wall motion abnormalities. The left ventricular internal cavity size was normal in size. There is  no left ventricular hypertrophy. Left ventricular diastolic parameters were normal. Normal left ventricular filling pressure. Right Ventricle: The right ventricular size is normal. No increase in right ventricular wall thickness. Right ventricular systolic function is normal. Tricuspid regurgitation signal is inadequate for assessing PA pressure. Left Atrium:  Left atrial size was mildly dilated. Right Atrium: Right atrial  size was normal in size. Pericardium: There is no evidence of pericardial effusion. Mitral Valve: The mitral valve is normal in structure. Normal mobility of the mitral valve leaflets. Moderate mitral annular calcification. Trivial mitral valve regurgitation. No evidence of mitral valve stenosis. Tricuspid Valve: The tricuspid valve is normal in structure. Tricuspid valve regurgitation is mild . No evidence of tricuspid stenosis. Aortic Valve: The aortic valve is tricuspid. Aortic valve regurgitation is not visualized. Mild aortic valve sclerosis is present, with no evidence of aortic valve stenosis. Pulmonic Valve: The pulmonic valve was normal in structure. Pulmonic valve regurgitation is not visualized. No evidence of pulmonic stenosis. Aorta: The aortic root is normal in size and structure. Venous: The inferior vena cava is normal in size with greater than 50% respiratory variability, suggesting right atrial pressure of 3 mmHg. IAS/Shunts: No atrial level shunt detected by color flow Doppler.  LEFT VENTRICLE PLAX 2D LVIDd:         5.20 cm  Diastology LVIDs:         3.90 cm  LV e' lateral:   11.20 cm/s LV PW:         0.90 cm  LV E/e' lateral: 7.8 LV IVS:        0.90 cm  LV e' medial:    10.20 cm/s LVOT diam:     2.10 cm  LV E/e' medial:  8.6 LV SV:         82 LV SV Index:   42 LVOT Area:     3.46 cm  RIGHT VENTRICLE RV S prime:     11.70 cm/s TAPSE (M-mode): 2.8 cm LEFT ATRIUM             Index       RIGHT ATRIUM           Index LA diam:        4.25 cm 2.16 cm/m  RA Area:     18.20 cm LA Vol (A2C):   71.1 ml 36.21 ml/m RA Volume:   52.00 ml  26.49 ml/m LA Vol (A4C):   64.7 ml 32.95 ml/m LA Biplane Vol: 70.5 ml 35.91 ml/m  AORTIC VALVE LVOT Vmax:   111.00 cm/s LVOT Vmean:  76.900 cm/s LVOT VTI:    0.238 m  AORTA Ao Root diam: 3.20 cm MITRAL VALVE MV Area (PHT): 4.31 cm    SHUNTS MV Decel Time: 176 msec    Systemic VTI:  0.24 m MV E velocity:  87.40 cm/s  Systemic Diam: 2.10 cm MV A velocity: 76.70 cm/s MV E/A ratio:  1.14 Fransico Him MD Electronically signed by Fransico Him MD Signature Date/Time: 07/03/2019/1:54:27 PM    Final    US THYROID  Result Date: 07/04/2019 CLINICAL DATA:  Incidental on CT. Right-sided thyroid nodule demonstrated on chest CT performed 07/03/2019. EXAM: THYROID ULTRASOUND TECHNIQUE: Ultrasound examination of the thyroid gland and adjacent soft tissues was performed. COMPARISON:  Chest CT-07/03/2019 FINDINGS: Parenchymal Echotexture: Normal Isthmus: Normal in size measuring 0.9 cm in diameter Right lobe: Borderline enlarged measuring 5.9 x 2.3 x 2.5 cm Left lobe: Normal in size measuring 5.6 x 1.7 x 2.5 cm _________________________________________________________ Estimated total number of nodules >/= 1 cm: 1 Number of spongiform nodules >/=  2 cm not described below (TR1): 0 Number of mixed cystic and solid nodules >/= 1.5 cm not described below (TR2): 0 _________________________________________________________ Nodule # 1: Location: Right; Mid Maximum size: 1.6 cm; Other 2 dimensions: 1.1 x 1.0 cm Composition: solid/almost completely solid (  2) Echogenicity: isoechoic (1) Shape: not taller-than-wide (0) Margins: lobulated/irregular (2) Echogenic foci: none (0) ACR TI-RADS total points: 5. ACR TI-RADS risk category: TR4 (4-6 points). ACR TI-RADS recommendations: **Given size (>/= 1.5 cm) and appearance, fine needle aspiration of this moderately suspicious nodule should be considered based on TI-RADS criteria. _________________________________________________________ IMPRESSION: Solitary right-sided thyroid nodule meets imaging criteria to recommend percutaneous sampling as indicated. The above is in keeping with the ACR TI-RADS recommendations - J Am Coll Radiol 2017;14:587-595. Electronically Signed   By: Simonne Come M.D.   On: 07/04/2019 09:37    Scheduled Meds:  aspirin EC  81 mg Oral Daily   atorvastatin  20 mg Oral  Daily   Chlorhexidine Gluconate Cloth  6 each Topical Daily   folic acid  1 mg Oral Daily   mouth rinse  15 mL Mouth Rinse BID   multivitamin with minerals  1 tablet Oral Daily   thiamine  100 mg Oral Daily   Or   thiamine  100 mg Intravenous Daily   Continuous Infusions:  heparin 1,450 Units/hr (07/05/19 0400)     LOS: 2 days   Time spent: 25 minutes.  Tyrone Nine, MD [facility] [email or web address] 07/05/2019, 11:06 AM

## 2019-07-05 NOTE — Progress Notes (Signed)
[place] for Heparin Indication: chest pain/ACS  Allergies  Allergen Reactions  . Penicillins Anaphylaxis and Hives    Has patient had a PCN reaction causing immediate rash, facial/tongue/throat swelling, SOB or lightheadedness with hypotension: Yes Has patient had a PCN reaction causing severe rash involving mucus membranes or skin necrosis: Yes Has patient had a PCN reaction that required hospitalization: No Has patient had a PCN reaction occurring within the last 10 years: Yes If all of the above answers are "NO", then may proceed with Cephalosporin use.   Marland Kitchen Amoxicillin Hives   Patient Measurements: Height: 6' (182.9 cm) Weight: 75.4 kg (166 lb 3.6 oz) IBW/kg (Calculated) : 77.6 Heparin Dosing Weight: actual weight  Vital Signs: Temp: 98.2 F (36.8 C) (05/16 1050) Temp Source: Oral (05/16 1050) BP: 113/76 (05/16 1050) Pulse Rate: 65 (05/16 1050)  Labs: Recent Labs    07/03/19 0550 07/03/19 0734 07/03/19 1035 07/03/19 1218 07/03/19 2255 07/03/19 2255 07/04/19 0609 07/04/19 0945 07/04/19 1458 07/05/19 0158  HGB 11.9*   < >  --   --   --   --  11.3*  --   --  11.4*  HCT 35.2*  --   --   --   --   --  33.8*  --   --  33.5*  PLT 331  --   --   --   --   --  290  --   --  291  APTT  --   --  25  --   --   --   --   --   --   --   LABPROT  --   --  13.6  --   --   --   --   --   --   --   INR  --   --  1.1  --   --   --   --   --   --   --   HEPARINUNFRC  --   --   --    < > 0.18*   < > 0.34  --  0.39 0.34  CREATININE 0.79  --   --   --   --   --  0.88  --   --  0.79  TROPONINIHS 432*   < >  --    < > 10,324*  --  11,980* 11,928*  --   --    < > = values in this interval not displayed.   Estimated Creatinine Clearance: 113.9 mL/min (by C-G formula based on SCr of 0.79 mg/dL).  Medical History: Past Medical History:  Diagnosis Date  . GERD (gastroesophageal reflux disease)   . Mixed hyperlipidemia     Medications:   Medications Prior to Admission  Medication Sig Dispense Refill Last Dose  . Esomeprazole Magnesium (NEXIUM 24HR) 20 MG TBEC Take 20 mg by mouth daily.   07/02/2019 at Unknown time  . naproxen sodium (ALEVE) 220 MG tablet Take 440 mg by mouth 2 (two) times daily as needed (back pain).   Past Month at Unknown time  . tetrahydrozoline-zinc (VISINE-AC) 0.05-0.25 % ophthalmic solution Place 2 drops into both eyes 3 (three) times daily as needed (red eyes dry eye).   Past Week at Unknown time  . erythromycin ophthalmic ointment Place a 1/2 inch ribbon of ointment into the lower eyelid of the left eye 4 times a day for 5 days (Patient not taking: Reported on  10/24/2017) 3.5 g 0   . omeprazole (PRILOSEC) 20 MG capsule Take 1 capsule (20 mg total) by mouth daily for 14 days. 14 capsule 1    Assessment: 54 y/o M admitted with CP after using cocaine. CT chest negative for acute findings.EKG without evidence of ischemia. Troponin elevated. Cards consulted.   Heparin level therapeutic today. Plan for cath tomorrow. H/H stable.   Goal of Therapy:  Heparin level 0.3-0.7 units/ml Monitor platelets by anticoagulation protocol: Yes   Plan:   Continue IV heparin at 1450 units/hr   Daily heparin level and CBC  Monitor for signs/symptoms of bleeding  Ulyses Southward, PharmD, BCIDP, AAHIVP, CPP Infectious Disease Pharmacist 07/05/2019 1:13 PM

## 2019-07-05 NOTE — Progress Notes (Signed)
Progress Note  Patient Name: Ryan Owen Date of Encounter: 07/05/2019  Primary Cardiologist: No primary care provider on file.   Subjective   Denies any chest pain or dyspnea.  Troponin peaked at 11,980.  Cr stable at 0.79.  BP stable at 120/84.  Inpatient Medications    Scheduled Meds: . aspirin EC  81 mg Oral Daily  . atorvastatin  20 mg Oral Daily  . Chlorhexidine Gluconate Cloth  6 each Topical Daily  . folic acid  1 mg Oral Daily  . mouth rinse  15 mL Mouth Rinse BID  . multivitamin with minerals  1 tablet Oral Daily  . thiamine  100 mg Oral Daily   Or  . thiamine  100 mg Intravenous Daily   Continuous Infusions: . heparin 1,450 Units/hr (07/05/19 0400)   PRN Meds: acetaminophen, LORazepam **OR** LORazepam, nitroGLYCERIN, ondansetron (ZOFRAN) IV   Vital Signs    Vitals:   07/04/19 2000 07/04/19 2338 07/05/19 0339 07/05/19 0715  BP:  110/75 124/90 120/84  Pulse: (!) 58 61 61 64  Resp: 20 14 14 13   Temp:  98 F (36.7 C) 98.2 F (36.8 C) 98.4 F (36.9 C)  TempSrc:  Oral Oral Oral  SpO2: 98% 99% 96% 98%  Weight:      Height:        Intake/Output Summary (Last 24 hours) at 07/05/2019 0938 Last data filed at 07/05/2019 0716 Gross per 24 hour  Intake 1482.52 ml  Output 375 ml  Net 1107.52 ml   Last 3 Weights 07/04/2019 07/03/2019 07/03/2019  Weight (lbs) 166 lb 3.6 oz 166 lb 10.7 oz 165 lb  Weight (kg) 75.4 kg 75.6 kg 74.844 kg      Telemetry    Sinus rhythm, rate 60-70s- Personally Reviewed  ECG    Sinus bradycardia, rate 49, no ST abnormalities- Personally Reviewed  Physical Exam   GEN: No acute distress.   Neck: No JVD Cardiac: bradycardia, no murmurs, rubs, or gallops.  Respiratory: Clear to auscultation bilaterally. GI: Soft, nontender, non-distended  MS: No edema; No deformity. Neuro:  Nonfocal  Psych: Normal affect   Labs    High Sensitivity Troponin:   Recent Labs  Lab 07/03/19 1218 07/03/19 1845 07/03/19 2255  07/04/19 0609 07/04/19 0945  TROPONINIHS 4,193* 5,873* 10,324* 11,980* 11,928*      Chemistry Recent Labs  Lab 07/03/19 0550 07/04/19 0609 07/05/19 0158  NA 139 136 138  K 3.3* 3.7 3.5  CL 107 105 107  CO2 23 23 23   GLUCOSE 91 94 105*  BUN 7 11 8   CREATININE 0.79 0.88 0.79  CALCIUM 8.4* 8.9 8.5*  GFRNONAA >60 >60 >60  GFRAA >60 >60 >60  ANIONGAP 9 8 8      Hematology Recent Labs  Lab 07/03/19 0550 07/04/19 0609 07/05/19 0158  WBC 9.2 13.9* 10.7*  RBC 3.54* 3.41* 3.46*  HGB 11.9* 11.3* 11.4*  HCT 35.2* 33.8* 33.5*  MCV 99.4 99.1 96.8  MCH 33.6 33.1 32.9  MCHC 33.8 33.4 34.0  RDW 15.1 15.1 14.9  PLT 331 290 291    BNPNo results for input(s): BNP, PROBNP in the last 168 hours.   DDimer No results for input(s): DDIMER in the last 168 hours.   Radiology    ECHOCARDIOGRAM COMPLETE  Result Date: 07/03/2019    ECHOCARDIOGRAM REPORT   Patient Name:   Ryan Owen Date of Exam: 07/03/2019 Medical Rec #:  07/07/19       Height:  72.0 in Accession #:    [id number]      Weight:       165.0 lb Date of Birth:  [birth year]       BSA:          1.963 m Patient Age:    53 years        BP:           134/86 mmHg Patient Gender: M               HR:           55 bpm. Exam Location:  Inpatient Procedure: 2D Echo Indications:   Chest Pain 786.50 / R07.9  History:       Patient has no prior history of Echocardiogram examinations.                NSTEMI.  Sonographer:   Leeroy Bock Turrentine Referring      [id number] Beatriz Stallion Phys: IMPRESSIONS  1. Left ventricular ejection fraction, by estimation, is 60 to 65%. The left ventricle has normal function. The left ventricle has no regional wall motion abnormalities. Left ventricular diastolic parameters were normal.  2. Right ventricular systolic function is normal. The right ventricular size is normal. Tricuspid regurgitation signal is inadequate for assessing PA pressure.  3. Left atrial size was mildly dilated.  4. The mitral valve is  normal in structure. Trivial mitral valve regurgitation. No evidence of mitral stenosis.  5. The aortic valve is tricuspid. Aortic valve regurgitation is not visualized. Mild aortic valve sclerosis is present, with no evidence of aortic valve stenosis.  6. The inferior vena cava is normal in size with greater than 50% respiratory variability, suggesting right atrial pressure of 3 mmHg. FINDINGS  Left Ventricle: Left ventricular ejection fraction, by estimation, is 60 to 65%. The left ventricle has normal function. The left ventricle has no regional wall motion abnormalities. The left ventricular internal cavity size was normal in size. There is  no left ventricular hypertrophy. Left ventricular diastolic parameters were normal. Normal left ventricular filling pressure. Right Ventricle: The right ventricular size is normal. No increase in right ventricular wall thickness. Right ventricular systolic function is normal. Tricuspid regurgitation signal is inadequate for assessing PA pressure. Left Atrium: Left atrial size was mildly dilated. Right Atrium: Right atrial size was normal in size. Pericardium: There is no evidence of pericardial effusion. Mitral Valve: The mitral valve is normal in structure. Normal mobility of the mitral valve leaflets. Moderate mitral annular calcification. Trivial mitral valve regurgitation. No evidence of mitral valve stenosis. Tricuspid Valve: The tricuspid valve is normal in structure. Tricuspid valve regurgitation is mild . No evidence of tricuspid stenosis. Aortic Valve: The aortic valve is tricuspid. Aortic valve regurgitation is not visualized. Mild aortic valve sclerosis is present, with no evidence of aortic valve stenosis. Pulmonic Valve: The pulmonic valve was normal in structure. Pulmonic valve regurgitation is not visualized. No evidence of pulmonic stenosis. Aorta: The aortic root is normal in size and structure. Venous: The inferior vena cava is normal in size with greater  than 50% respiratory variability, suggesting right atrial pressure of 3 mmHg. IAS/Shunts: No atrial level shunt detected by color flow Doppler.  LEFT VENTRICLE PLAX 2D LVIDd:         5.20 cm  Diastology LVIDs:         3.90 cm  LV e' lateral:   11.20 cm/s LV PW:         0.90 cm  LV E/e' lateral: 7.8 LV IVS:        0.90 cm  LV e' medial:    10.20 cm/s LVOT diam:     2.10 cm  LV E/e' medial:  8.6 LV SV:         82 LV SV Index:   42 LVOT Area:     3.46 cm  RIGHT VENTRICLE RV S prime:     11.70 cm/s TAPSE (M-mode): 2.8 cm LEFT ATRIUM             Index       RIGHT ATRIUM           Index LA diam:        4.25 cm 2.16 cm/m  RA Area:     18.20 cm LA Vol (A2C):   71.1 ml 36.21 ml/m RA Volume:   52.00 ml  26.49 ml/m LA Vol (A4C):   64.7 ml 32.95 ml/m LA Biplane Vol: 70.5 ml 35.91 ml/m  AORTIC VALVE LVOT Vmax:   111.00 cm/s LVOT Vmean:  76.900 cm/s LVOT VTI:    0.238 m  AORTA Ao Root diam: 3.20 cm MITRAL VALVE MV Area (PHT): 4.31 cm    SHUNTS MV Decel Time: 176 msec    Systemic VTI:  0.24 m MV E velocity: 87.40 cm/s  Systemic Diam: 2.10 cm MV A velocity: 76.70 cm/s MV E/A ratio:  1.14 Armanda Magic MD Electronically signed by Armanda Magic MD Signature Date/Time: 07/03/2019/1:54:27 PM    Final    US THYROID  Result Date: 07/04/2019 CLINICAL DATA:  Incidental on CT. Right-sided thyroid nodule demonstrated on chest CT performed 07/03/2019. EXAM: THYROID ULTRASOUND TECHNIQUE: Ultrasound examination of the thyroid gland and adjacent soft tissues was performed. COMPARISON:  Chest CT-07/03/2019 FINDINGS: Parenchymal Echotexture: Normal Isthmus: Normal in size measuring 0.9 cm in diameter Right lobe: Borderline enlarged measuring 5.9 x 2.3 x 2.5 cm Left lobe: Normal in size measuring 5.6 x 1.7 x 2.5 cm _________________________________________________________ Estimated total number of nodules >/= 1 cm: 1 Number of spongiform nodules >/=  2 cm not described below (TR1): 0 Number of mixed cystic and solid nodules >/= 1.5 cm not  described below (TR2): 0 _________________________________________________________ Nodule # 1: Location: Right; Mid Maximum size: 1.6 cm; Other 2 dimensions: 1.1 x 1.0 cm Composition: solid/almost completely solid (2) Echogenicity: isoechoic (1) Shape: not taller-than-wide (0) Margins: lobulated/irregular (2) Echogenic foci: none (0) ACR TI-RADS total points: 5. ACR TI-RADS risk category: TR4 (4-6 points). ACR TI-RADS recommendations: **Given size (>/= 1.5 cm) and appearance, fine needle aspiration of this moderately suspicious nodule should be considered based on TI-RADS criteria. _________________________________________________________ IMPRESSION: Solitary right-sided thyroid nodule meets imaging criteria to recommend percutaneous sampling as indicated. The above is in keeping with the ACR TI-RADS recommendations - J Am Coll Radiol 2017;14:587-595. Electronically Signed   By: Simonne Come M.D.   On: 07/04/2019 09:37    Cardiac Studies   TTE 07/03/19: 1. Left ventricular ejection fraction, by estimation, is 60 to 65%. The  left ventricle has normal function. The left ventricle has no regional  wall motion abnormalities. Left ventricular diastolic parameters were  normal.  2. Right ventricular systolic function is normal. The right ventricular  size is normal. Tricuspid regurgitation signal is inadequate for assessing  PA pressure.  3. Left atrial size was mildly dilated.  4. The mitral valve is normal in structure. Trivial mitral valve  regurgitation. No evidence of mitral stenosis.  5. The aortic valve is tricuspid. Aortic  valve regurgitation is not  visualized. Mild aortic valve sclerosis is present, with no evidence of  aortic valve stenosis.  6. The inferior vena cava is normal in size with greater than 50%  respiratory variability, suggesting right atrial pressure of 3 mmHg.   Patient Profile     54 y.o. male  with a a PMH of GERD, cocaine abuse, and tobacco abuse  who presents  with NSTEMI  Assessment & Plan    1.  NSTEMI: patient presented with chest pain following cocaine use.  Currently chest pain-free.  Marked troponin elevation, up to 10,324.  TTE showed normal LV systolic function.  CTA C/A/P was without aortic aneurysm or dissection.  No known CAD history, though noted to have calcifications of coronary arteries on CTA C/A/P today. EKG shows no ischemic changes.  Risk factors for CAD include likely unmanaged HTN, hypertriglyceridemia, pre-diabetes, and tobacco abuse.   Utox positive for cocaine and THC.   -Continue aspirin 81 mg daily -Continue heparin drip -Continue atorvastatin -Plan for left heart catheterization tomorrow.  Risks and benefits of cardiac catheterization have been discussed with the patient.  These include bleeding, infection, kidney damage, stroke, heart attack, death.  The patient understands these risks and is willing to proceed.  2. Elevated BP:  Noted on admission, is now normotensive.  3. Mixed HLD: Triglycerides 276 this admission, LDL 84. He does have coronary artery calcifications on CTA chest so likely has some underlying CAD. Favor aggressive risk factor modifications -Started atorvastatin 20mg  daily  4. Pre-diabetes: A1C 5.7 - Encourage healthy dietary/lifestyle modifications to prevent progression  5. Tobacco abuse: smoking 1/2 ppd. Risks of continued use discussed - Continue to encourage cessation  6. Cocaine abuse: reports smoking ~once per week. Risks of continue use discussed - Continue to encourage abstinence  7. ETOH abuse: drinks 2 40oz beers a day. No history of withdrawals. Last drink yesterday evening.  - Would monitor CIWA while admitted - will defer to primary team.   For questions or updates, please contact [place] Please consult [email or web address] for contact info under        Signed, Donato Heinz, MD  07/05/2019, 9:38 AM

## 2019-07-06 ENCOUNTER — Encounter (HOSPITAL_COMMUNITY)
Admission: EM | Disposition: A | Discharge status: Home / Self Care | Payer: Self-pay | Source: Home / Self Care | Attending: Family Medicine

## 2019-07-06 DIAGNOSIS — I10 Essential (primary) hypertension: Secondary | ICD-10-CM

## 2019-07-06 DIAGNOSIS — I2510 Atherosclerotic heart disease of native coronary artery without angina pectoris: Secondary | ICD-10-CM

## 2019-07-06 DIAGNOSIS — E782 Mixed hyperlipidemia: Secondary | ICD-10-CM

## 2019-07-06 HISTORY — PX: LEFT HEART CATH AND CORONARY ANGIOGRAPHY: CATH118249

## 2019-07-06 LAB — COMPREHENSIVE METABOLIC PANEL
ALT: 34 U/L (ref 0–44)
AST: 38 U/L (ref 15–41)
Albumin: 3.2 g/dL — ABNORMAL LOW (ref 3.5–5.0)
Alkaline Phosphatase: 77 U/L (ref 38–126)
Anion gap: 5 (ref 5–15)
BUN: 8 mg/dL (ref 6–20)
CO2: 24 mmol/L (ref 22–32)
Calcium: 8.8 mg/dL — ABNORMAL LOW (ref 8.9–10.3)
Chloride: 109 mmol/L (ref 98–111)
Creatinine, Ser: 0.75 mg/dL (ref 0.61–1.24)
GFR calc Af Amer: >60 mL/min (ref >60)
GFR calc non Af Amer: >60 mL/min (ref >60)
Glucose, Bld: 109 mg/dL — ABNORMAL HIGH (ref 70–99)
Potassium: 3.8 mmol/L (ref 3.5–5.1)
Sodium: 138 mmol/L (ref 135–145)
Total Bilirubin: 0.3 mg/dL (ref 0.3–1.2)
Total Protein: 6.4 g/dL — ABNORMAL LOW (ref 6.5–8.1)

## 2019-07-06 LAB — CBC
HCT: 35.1 % — ABNORMAL LOW (ref 39.0–52.0)
Hemoglobin: 11.9 g/dL — ABNORMAL LOW (ref 13.0–17.0)
MCH: 33.1 pg (ref 26.0–34.0)
MCHC: 33.9 g/dL (ref 30.0–36.0)
MCV: 97.8 fL (ref 80.0–100.0)
Platelets: 298 10*3/uL (ref 150–400)
RBC: 3.59 MIL/uL — ABNORMAL LOW (ref 4.22–5.81)
RDW: 14.6 % (ref 11.5–15.5)
WBC: 9.7 10*3/uL (ref 4.0–10.5)
nRBC: 0 % (ref 0.0–0.2)

## 2019-07-06 LAB — TSH: TSH: 1.225 u[IU]/mL (ref 0.350–4.500)

## 2019-07-06 LAB — HEPARIN LEVEL (UNFRACTIONATED): Heparin Unfractionated: 0.36 IU/mL (ref 0.30–0.70)

## 2019-07-06 SURGERY — LEFT HEART CATH AND CORONARY ANGIOGRAPHY
Anesthesia: LOCAL

## 2019-07-06 MED ORDER — SODIUM CHLORIDE 0.9% FLUSH: 3.0000 mL | Freq: Two times a day (BID) | INTRAVENOUS | Status: DC

## 2019-07-06 MED ORDER — ACETAMINOPHEN 325 MG PO TABS: 650.0000 mg | ORAL_TABLET | ORAL | Status: DC | PRN

## 2019-07-06 MED ORDER — HEPARIN (PORCINE) IN NACL 1000-0.9 UT/500ML-% IV SOLN
INTRAVENOUS | Status: DC | PRN
  Administered 2019-07-06: 500 mL

## 2019-07-06 MED ORDER — IOHEXOL 350 MG/ML SOLN
INTRAVENOUS | Status: DC | PRN
  Administered 2019-07-06: 40 mL

## 2019-07-06 MED ORDER — SODIUM CHLORIDE 0.9 % IV SOLN: INTRAVENOUS | Status: AC

## 2019-07-06 MED ORDER — HEPARIN SODIUM (PORCINE) 1000 UNIT/ML IJ SOLN
INTRAMUSCULAR | Status: DC | PRN
  Administered 2019-07-06: 3500 [IU] via INTRAVENOUS

## 2019-07-06 MED ORDER — LIDOCAINE HCL (PF) 1 % IJ SOLN
INTRAMUSCULAR | Status: AC
  Filled 2019-07-06: qty 30

## 2019-07-06 MED ORDER — VERAPAMIL HCL 2.5 MG/ML IV SOLN
INTRAVENOUS | Status: AC
  Filled 2019-07-06: qty 2

## 2019-07-06 MED ORDER — FENTANYL CITRATE (PF) 100 MCG/2ML IJ SOLN
INTRAMUSCULAR | Status: DC | PRN
  Administered 2019-07-06: 25 ug via INTRAVENOUS

## 2019-07-06 MED ORDER — FENTANYL CITRATE (PF) 100 MCG/2ML IJ SOLN
INTRAMUSCULAR | Status: AC
  Filled 2019-07-06: qty 2

## 2019-07-06 MED ORDER — SODIUM CHLORIDE 0.9% FLUSH: 3.0000 mL | INTRAVENOUS | Status: DC | PRN

## 2019-07-06 MED ORDER — MIDAZOLAM HCL 2 MG/2ML IJ SOLN
INTRAMUSCULAR | Status: DC | PRN
  Administered 2019-07-06: 1 mg via INTRAVENOUS

## 2019-07-06 MED ORDER — SODIUM CHLORIDE 0.9 % IV SOLN: 250.0000 mL | INTRAVENOUS | Status: DC | PRN

## 2019-07-06 MED ORDER — VERAPAMIL HCL 2.5 MG/ML IV SOLN
INTRA_ARTERIAL | Status: DC | PRN
  Administered 2019-07-06: 5 mL via INTRA_ARTERIAL

## 2019-07-06 MED ORDER — HYDRALAZINE HCL 20 MG/ML IJ SOLN: 10.0000 mg | INTRAMUSCULAR | Status: AC | PRN

## 2019-07-06 MED ORDER — MORPHINE SULFATE (PF) 2 MG/ML IV SOLN: 2.0000 mg | INTRAVENOUS | Status: DC | PRN

## 2019-07-06 MED ORDER — ASPIRIN 81 MG PO CHEW: 81.0000 mg | CHEWABLE_TABLET | Freq: Every day | ORAL | Status: DC

## 2019-07-06 MED ORDER — ESOMEPRAZOLE MAGNESIUM 20 MG PO TBEC: 20.0000 mg | DELAYED_RELEASE_TABLET | Freq: Every day | ORAL | Status: DC

## 2019-07-06 MED ORDER — HEPARIN SODIUM (PORCINE) 1000 UNIT/ML IJ SOLN
INTRAMUSCULAR | Status: AC
  Filled 2019-07-06: qty 1

## 2019-07-06 MED ORDER — HEPARIN (PORCINE) IN NACL 1000-0.9 UT/500ML-% IV SOLN
INTRAVENOUS | Status: AC
  Filled 2019-07-06: qty 1000

## 2019-07-06 MED ORDER — LABETALOL HCL 5 MG/ML IV SOLN: 10.0000 mg | INTRAVENOUS | Status: AC | PRN

## 2019-07-06 MED ORDER — NITROGLYCERIN 1 MG/10 ML FOR IR/CATH LAB
INTRA_ARTERIAL | Status: AC
  Filled 2019-07-06: qty 10

## 2019-07-06 MED ORDER — ATORVASTATIN CALCIUM 80 MG PO TABS
80.0000 mg | ORAL_TABLET | Freq: Every day | ORAL | Status: DC
  Administered 2019-07-07: 80 mg via ORAL
  Filled 2019-07-06: qty 1

## 2019-07-06 MED ORDER — PANTOPRAZOLE SODIUM 40 MG PO TBEC
40.0000 mg | DELAYED_RELEASE_TABLET | Freq: Every day | ORAL | Status: DC
  Administered 2019-07-07: 40 mg via ORAL
  Filled 2019-07-06: qty 1

## 2019-07-06 MED ORDER — LIDOCAINE HCL (PF) 1 % IJ SOLN
INTRAMUSCULAR | Status: DC | PRN
  Administered 2019-07-06: 2 mL

## 2019-07-06 MED ORDER — MIDAZOLAM HCL 2 MG/2ML IJ SOLN
INTRAMUSCULAR | Status: AC
  Filled 2019-07-06: qty 2

## 2019-07-06 MED ORDER — ONDANSETRON HCL 4 MG/2ML IJ SOLN: 4.0000 mg | Freq: Four times a day (QID) | INTRAMUSCULAR | Status: DC | PRN

## 2019-07-06 SURGICAL SUPPLY — 11 items

## 2019-07-06 NOTE — H&P (View-Only) (Signed)
Progress Note  Patient Name: Ryan Owen Date of Encounter: 07/06/2019  Primary Cardiologist: No primary care provider on file.   Subjective   Further chest pain overnight.  Patient is fasting waiting for cardiac catheterization.  Inpatient Medications    Scheduled Meds: . aspirin EC  81 mg Oral Daily  . atorvastatin  20 mg Oral Daily  . Chlorhexidine Gluconate Cloth  6 each Topical Daily  . folic acid  1 mg Oral Daily  . mouth rinse  15 mL Mouth Rinse BID  . multivitamin with minerals  1 tablet Oral Daily  . sodium chloride flush  3 mL Intravenous Q12H  . thiamine  100 mg Oral Daily   Or  . thiamine  100 mg Intravenous Daily   Continuous Infusions: . sodium chloride    . sodium chloride 1 mL/kg/hr (07/06/19 0510)  . heparin 1,450 Units/hr (07/06/19 0739)   PRN Meds: sodium chloride, acetaminophen, LORazepam **OR** LORazepam, nitroGLYCERIN, ondansetron (ZOFRAN) IV, sodium chloride flush   Vital Signs    Vitals:   07/05/19 2005 07/05/19 2337 07/06/19 0400 07/06/19 0444  BP: 124/90 115/84  110/90  Pulse: 71 (!) 57 (!) 57 (!) 59  Resp: 19 15 13 13   Temp: 98 F (36.7 C) 98.1 F (36.7 C)  97.9 F (36.6 C)  TempSrc: Oral Oral  Oral  SpO2: 98% 97% 96% 99%  Weight:      Height:        Intake/Output Summary (Last 24 hours) at 07/06/2019 1010 Last data filed at 07/06/2019 0403 Gross per 24 hour  Intake 828.57 ml  Output 300 ml  Net 528.57 ml   Last 3 Weights 07/04/2019 07/03/2019 07/03/2019  Weight (lbs) 166 lb 3.6 oz 166 lb 10.7 oz 165 lb  Weight (kg) 75.4 kg 75.6 kg 74.844 kg      Telemetry    Sinus rhythm, rate 60-70s- Personally Reviewed  ECG    Sinus bradycardia, rate 49, no ST abnormalities- Personally Reviewed  Physical Exam   GEN: No acute distress.   Neck: No JVD Cardiac: bradycardia, no murmurs, rubs, or gallops.  Respiratory: Clear to auscultation bilaterally. GI: Soft, nontender, non-distended  MS: No edema; No deformity. Neuro:   Nonfocal  Psych: Normal affect   Labs    High Sensitivity Troponin:   Recent Labs  Lab 07/03/19 1218 07/03/19 1845 07/03/19 2255 07/04/19 0609 07/04/19 0945  TROPONINIHS 4,193* 5,873* 10,324* 11,980* 11,928*      Chemistry Recent Labs  Lab 07/04/19 0609 07/05/19 0158 07/06/19 0233  NA 136 138 138  K 3.7 3.5 3.8  CL 105 107 109  CO2 23 23 24   GLUCOSE 94 105* 109*  BUN 11 8 8   CREATININE 0.88 0.79 0.75  CALCIUM 8.9 8.5* 8.8*  PROT  --   --  6.4*  ALBUMIN  --   --  3.2*  AST  --   --  38  ALT  --   --  34  ALKPHOS  --   --  77  BILITOT  --   --  0.3  GFRNONAA >60 >60 >60  GFRAA >60 >60 >60  ANIONGAP 8 8 5      Hematology Recent Labs  Lab 07/04/19 0609 07/05/19 0158 07/06/19 0233  WBC 13.9* 10.7* 9.7  RBC 3.41* 3.46* 3.59*  HGB 11.3* 11.4* 11.9*  HCT 33.8* 33.5* 35.1*  MCV 99.1 96.8 97.8  MCH 33.1 32.9 33.1  MCHC 33.4 34.0 33.9  RDW 15.1 14.9 14.6  PLT  290 291 298    BNPNo results for input(s): BNP, PROBNP in the last 168 hours.   DDimer No results for input(s): DDIMER in the last 168 hours.   Radiology    No results found.  Cardiac Studies   TTE 07/03/19: 1. Left ventricular ejection fraction, by estimation, is 60 to 65%. The  left ventricle has normal function. The left ventricle has no regional  wall motion abnormalities. Left ventricular diastolic parameters were  normal.  2. Right ventricular systolic function is normal. The right ventricular  size is normal. Tricuspid regurgitation signal is inadequate for assessing  PA pressure.  3. Left atrial size was mildly dilated.  4. The mitral valve is normal in structure. Trivial mitral valve  regurgitation. No evidence of mitral stenosis.  5. The aortic valve is tricuspid. Aortic valve regurgitation is not  visualized. Mild aortic valve sclerosis is present, with no evidence of  aortic valve stenosis.  6. The inferior vena cava is normal in size with greater than 50%  respiratory  variability, suggesting right atrial pressure of 3 mmHg.   Patient Profile     54 y.o. male  with a a PMH of GERD, cocaine abuse, and tobacco abuse  who presents with NSTEMI  Assessment & Plan    1.  NSTEMI: patient presented with chest pain following cocaine use.  Currently chest pain-free.  Marked troponin elevation, up to 10,324.  TTE showed normal LV systolic function.  CTA C/A/P was without aortic aneurysm or dissection.  No known CAD history, though noted to have calcifications of coronary arteries on CTA C/A/P today. EKG shows no ischemic changes.  Risk factors for CAD include likely unmanaged HTN, hypertriglyceridemia, pre-diabetes, and tobacco abuse.   Utox positive for cocaine and THC.   -Continue aspirin 81 mg daily -Continue heparin drip -Continue atorvastatin, I will increase to 80 mg daily -No beta-blockers as he is bradycardic at baseline. -Plan for left heart catheterization today.  Risks and benefits of cardiac catheterization have been discussed with the patient.  These include bleeding, infection, kidney damage, stroke, heart attack, death.  The patient understands these risks and is willing to proceed.  2. Elevated BP:  Noted on admission, is now normotensive.  3. Mixed HLD: Triglycerides 276 this admission, LDL 84. He does have coronary artery calcifications on CTA chest so likely has some underlying CAD. Favor aggressive risk factor modifications -Started atorvastatin 20mg  daily, I will increase to 80 and add fish oil 1000 mg p.o. twice daily.  4. Pre-diabetes: A1C 5.7 - Encourage healthy dietary/lifestyle modifications to prevent progression  5. Tobacco abuse: smoking 1/2 ppd. Risks of continued use discussed - Continue to encourage cessation  6. Cocaine abuse: reports smoking ~once per week. Risks of continue use discussed - Continue to encourage abstinence  7. ETOH abuse: drinks 2 40oz beers a day. No history of withdrawals. Last drink yesterday evening.  -  Would monitor CIWA while admitted - will defer to primary team.  For questions or updates, please contact [facility] Please consult [email or web address] for contact info under     Signed, , MD  07/06/2019, 10:10 AM

## 2019-07-06 NOTE — Progress Notes (Signed)
PROGRESS NOTE  Ryan Owen  DUK:[id number] DOB: 04/01/1965 DOA: 07/03/2019 PCP: Patient, No Pcp Per   Brief Narrative: Ryan Owen is a 54 y.o. male with a history of tobacco use, alcohol and cocaine abuse, HLD and GERD who presented with chest pain after using cocaine. Troponin was elevated >10,000 without STEMI on ECG. Heparin IV was started and patient transferred to [facility] in anticipation of LHC on 5/17.  Assessment & Plan: Active Problems:   NSTEMI (non-ST elevated myocardial infarction) (HCC)  NSTEMI: Precipitated by cocaine use in patient with CAD based on coronary calcifications. No wall motion abnormalities on echocardiogram. Chest pain currently resolved. - LHC this afternoon. - IV heparin - ASA - Statin, augmenting dose today to atorvastatin 80mg  - Not on beta blocker due to cocaine positivity and actually bradycardic at baseline.  Tobacco use:  - Nicotine patch  Cocaine use: +cocaine and THC on UDS. - Cessation counseling provided  Alcohol abuse: ~80oz beer/day, not currently withdrawing.  - CIWA - Cessation/moderation counseling provided.   HTN:  - Continue medications as ordered.  HLD:  - Continue statin, check LFTs in AM  GERD:  - Continue PPI  Incidental thyroid nodule: Meets criteria for FNA on U/S. Multiple right-sided thyroid nodules, largest measuring up to 13 mm. - TSH wnl - Defer work up to outpatient setting.  DVT prophylaxis: IV heparin Code Status: Full Family Communication: None at bedside Disposition Plan:  Status is: Inpatient  Remains inpatient appropriate because:Ongoing diagnostic testing needed not appropriate for outpatient work up   Dispo: The patient is from: Home              Anticipated d/c is to: Home              Anticipated d/c date is: 5/18              Patient currently is not medically stable to d/c.  Consultants:   Cardiology  Procedures:   LHC planned 07/06/2019  Antimicrobials:  None    Subjective: Wants to eat, no current chest pain or dyspnea or leg swelling or palpitations or other complaints.  Objective: Vitals:   07/05/19 2005 07/05/19 2337 07/06/19 0400 07/06/19 0444  BP: 124/90 115/84  110/90  Pulse: 71 (!) 57 (!) 57 (!) 59  Resp: 19 15 13 13   Temp: 98 F (36.7 C) 98.1 F (36.7 C)  97.9 F (36.6 C)  TempSrc: Oral Oral  Oral  SpO2: 98% 97% 96% 99%  Weight:      Height:        Intake/Output Summary (Last 24 hours) at 07/06/2019 1341 Last data filed at 07/06/2019 0403 Gross per 24 hour  Intake 487.06 ml  Output 300 ml  Net 187.06 ml   Filed Weights   07/03/19 0754 07/03/19 1830 07/04/19 1100  Weight: 74.8 kg 75.6 kg 75.4 kg   Gen: 54 y.o. male in no distress Pulm: Nonlabored breathing room air. Clear. CV: Regular rate and rhythm. No murmur, rub, or gallop. No JVD, no dependent edema. GI: Abdomen soft, non-tender, non-distended, with normoactive bowel sounds.  Ext: Warm, no deformities Skin: No rashes, lesions or ulcers on visualized skin. Neuro: Alert and oriented. No focal neurological deficits. Psych: Judgement and insight appear fair. Mood euthymic & affect congruent. Behavior is appropriate.    Data Reviewed: I have personally reviewed following labs and imaging studies  CBC: Recent Labs  Lab 07/03/19 0550 07/04/19 0609 07/05/19 0158 07/06/19 0233  WBC 9.2 13.9* 10.7* 9.7  HGB 11.9* 11.3* 11.4* 11.9*  HCT 35.2* 33.8* 33.5* 35.1*  MCV 99.4 99.1 96.8 97.8  PLT 331 290 291 568   Basic Metabolic Panel: Recent Labs  Lab 07/03/19 0550 07/03/19 1250 07/04/19 0609 07/05/19 0158 07/06/19 0233  NA 139  --  136 138 138  K 3.3*  --  3.7 3.5 3.8  CL 107  --  105 107 109  CO2 23  --  23 23 24   GLUCOSE 91  --  94 105* 109*  BUN 7  --  11 8 8   CREATININE 0.79  --  0.88 0.79 0.75  CALCIUM 8.4*  --  8.9 8.5* 8.8*  MG  --  1.9  --   --   --   PHOS  --  3.8  --   --   --    GFR: Estimated Creatinine Clearance: 113.9 mL/min (by C-G  formula based on SCr of 0.75 mg/dL). Liver Function Tests: Recent Labs  Lab 07/06/19 0233  AST 38  ALT 34  ALKPHOS 77  BILITOT 0.3  PROT 6.4*  ALBUMIN 3.2*   No results for input(s): LIPASE, AMYLASE in the last 168 hours. No results for input(s): AMMONIA in the last 168 hours. Coagulation Profile: Recent Labs  Lab 07/03/19 1035  INR 1.1   Cardiac Enzymes: No results for input(s): CKTOTAL, CKMB, CKMBINDEX, TROPONINI in the last 168 hours. BNP (last 3 results) No results for input(s): PROBNP in the last 8760 hours. HbA1C: No results for input(s): HGBA1C in the last 72 hours. CBG: No results for input(s): GLUCAP in the last 168 hours. Lipid Profile: No results for input(s): CHOL, HDL, LDLCALC, TRIG, CHOLHDL, LDLDIRECT in the last 72 hours. Thyroid Function Tests: Recent Labs    07/06/19 0233  TSH 1.225   Anemia Panel: No results for input(s): VITAMINB12, FOLATE, FERRITIN, TIBC, IRON, RETICCTPCT in the last 72 hours. Urine analysis:    Component Value Date/Time   COLORURINE STRAW (A) 10/24/2017 1729   APPEARANCEUR CLEAR 10/24/2017 1729   LABSPEC 1.013 10/24/2017 1729   PHURINE 5.0 10/24/2017 1729   GLUCOSEU NEGATIVE 10/24/2017 1729   HGBUR NEGATIVE 10/24/2017 1729   BILIRUBINUR NEGATIVE 10/24/2017 1729   KETONESUR NEGATIVE 10/24/2017 1729   PROTEINUR NEGATIVE 10/24/2017 1729   NITRITE NEGATIVE 10/24/2017 1729   LEUKOCYTESUR MODERATE (A) 10/24/2017 1729   Recent Results (from the past 240 hour(s))  SARS Coronavirus 2 by RT PCR (hospital order, performed in [facility] hospital lab) Nasopharyngeal Nasopharyngeal Swab     Status: None   Collection Time: 07/03/19  7:15 AM   Specimen: Nasopharyngeal Swab  Result Value Ref Range Status   SARS Coronavirus 2 NEGATIVE NEGATIVE Final    Comment: (NOTE) SARS-CoV-2 target nucleic acids are NOT DETECTED. The SARS-CoV-2 RNA is generally detectable in upper and lower respiratory specimens during the acute phase of  infection. The lowest concentration of SARS-CoV-2 viral copies this assay can detect is 250 copies / mL. A negative result does not preclude SARS-CoV-2 infection and should not be used as the sole basis for treatment or other patient management decisions.  A negative result may occur with improper specimen collection / handling, submission of specimen other than nasopharyngeal swab, presence of viral mutation(s) within the areas targeted by this assay, and inadequate number of viral copies (<250 copies / mL). A negative result must be combined with clinical observations, patient history, and epidemiological information. Fact Sheet for Patients:   StrictlyIdeas.no Fact Sheet for Healthcare Providers: BankingDealers.co.za This  test is not yet approved or cleared  by the [place] and has been authorized for detection and/or diagnosis of SARS-CoV-2 by FDA under an Emergency Use Authorization (EUA).  This EUA will remain in effect (meaning this test can be used) for the duration of the COVID-19 declaration under Section 564(b)(1) of the Act, 21 U.S.C. section 360bbb-3(b)(1), unless the authorization is terminated or revoked sooner. Performed at [facility], [address] [place], [place] [address]   MRSA PCR Screening     Status: None   Collection Time: 07/03/19  6:37 PM   Specimen: Nasal Mucosa; Nasopharyngeal  Result Value Ref Range Status   MRSA by PCR NEGATIVE NEGATIVE Final    Comment:        The GeneXpert MRSA Assay (FDA approved for NASAL specimens only), is one component of a comprehensive MRSA colonization surveillance program. It is not intended to diagnose MRSA infection nor to guide or monitor treatment for MRSA infections. Performed at [facility], [address], [place], [place] [address]       Radiology Studies: No results found.  Scheduled Meds: . aspirin EC  81  mg Oral Daily  . [START ON 07/07/2019] atorvastatin  80 mg Oral Daily  . Chlorhexidine Gluconate Cloth  6 each Topical Daily  . folic acid  1 mg Oral Daily  . mouth rinse  15 mL Mouth Rinse BID  . multivitamin with minerals  1 tablet Oral Daily  . sodium chloride flush  3 mL Intravenous Q12H  . thiamine  100 mg Oral Daily   Or  . thiamine  100 mg Intravenous Daily   Continuous Infusions: . sodium chloride    . sodium chloride 1 mL/kg/hr (07/06/19 0510)  . heparin 1,450 Units/hr (07/06/19 0739)     LOS: 3 days   Time spent: 25 minutes.  Tyrone Nine, MD [facility] [email or web address] 07/06/2019, 1:41 PM

## 2019-07-06 NOTE — Interval H&P Note (Signed)
Cath Lab Visit (complete for each Cath Lab visit)  Clinical Evaluation Leading to the Procedure:   ACS: Yes.    Non-ACS:    Anginal Classification: CCS II  Anti-ischemic medical therapy: No Therapy  Non-Invasive Test Results: No non-invasive testing performed  Prior CABG: No previous CABG      History and Physical Interval Note:  07/06/2019 2:55 PM  Ryan Owen  has presented today for surgery, with the diagnosis of NSTEMI.  The various methods of treatment have been discussed with the patient and family. After consideration of risks, benefits and other options for treatment, the patient has consented to  Procedure(s): LEFT HEART CATH AND CORONARY ANGIOGRAPHY (N/A) as a surgical intervention.  The patient's history has been reviewed, patient examined, no change in status, stable for surgery.  I have reviewed the patient's chart and labs.  Questions were answered to the patient's satisfaction.     Nanetta Batty

## 2019-07-06 NOTE — Progress Notes (Signed)
ANTICOAGULATION CONSULT NOTE   Pharmacy Consult for Heparin Indication: chest pain/ACS  Allergies  Allergen Reactions  . Penicillins Anaphylaxis and Hives    Has patient had a PCN reaction causing immediate rash, facial/tongue/throat swelling, SOB or lightheadedness with hypotension: Yes Has patient had a PCN reaction causing severe rash involving mucus membranes or skin necrosis: Yes Has patient had a PCN reaction that required hospitalization: No Has patient had a PCN reaction occurring within the last 10 years: Yes If all of the above answers are "NO", then may proceed with Cephalosporin use.   Marland Kitchen Amoxicillin Hives   Patient Measurements: Height: 6' (182.9 cm) Weight: 75.4 kg (166 lb 3.6 oz) IBW/kg (Calculated) : 77.6 Heparin Dosing Weight: actual weight  Vital Signs: Temp: 97.9 F (36.6 C) (05/17 0444) Temp Source: Oral (05/17 0444) BP: 110/90 (05/17 0444) Pulse Rate: 59 (05/17 0444)  Labs: Recent Labs    07/03/19 1035 07/03/19 1218 07/03/19 2255 07/03/19 2255 07/04/19 1761 07/04/19 0609 07/04/19 0945 07/04/19 1458 07/05/19 0158 07/06/19 0233  HGB  --   --   --   --  11.3*   < >  --   --  11.4* 11.9*  HCT  --   --   --   --  33.8*  --   --   --  33.5* 35.1*  PLT  --   --   --   --  290  --   --   --  291 298  APTT 25  --   --   --   --   --   --   --   --   --   LABPROT 13.6  --   --   --   --   --   --   --   --   --   INR 1.1  --   --   --   --   --   --   --   --   --   HEPARINUNFRC  --    < > 0.18*   < > 0.34   < >  --  0.39 0.34 0.36  CREATININE  --   --   --   --  0.88  --   --   --  0.79 0.75  TROPONINIHS  --    < > 10,324*  --  11,980*  --  11,928*  --   --   --    < > = values in this interval not displayed.   Estimated Creatinine Clearance: 113.9 mL/min (by C-G formula based on SCr of 0.75 mg/dL).  Medical History: Past Medical History:  Diagnosis Date  . GERD (gastroesophageal reflux disease)   . Mixed hyperlipidemia     Medications:   Medications Prior to Admission  Medication Sig Dispense Refill Last Dose  . Esomeprazole Magnesium (NEXIUM 24HR) 20 MG TBEC Take 20 mg by mouth daily.   07/02/2019 at Unknown time  . naproxen sodium (ALEVE) 220 MG tablet Take 440 mg by mouth 2 (two) times daily as needed (back pain).   Past Month at Unknown time  . tetrahydrozoline-zinc (VISINE-AC) 0.05-0.25 % ophthalmic solution Place 2 drops into both eyes 3 (three) times daily as needed (red eyes dry eye).   Past Week at Unknown time  . erythromycin ophthalmic ointment Place a 1/2 inch ribbon of ointment into the lower eyelid of the left eye 4 times a day for 5 days (Patient not taking: Reported on  10/24/2017) 3.5 g 0   . omeprazole (PRILOSEC) 20 MG capsule Take 1 capsule (20 mg total) by mouth daily for 14 days. 14 capsule 1    Assessment: 54 y/o M admitted with CP after using cocaine. CT chest negative for acute findings.EKG without evidence of ischemia. Troponin elevated. Cards consulted.   Heparin level at goal this morning today. Plan for cath today. CBC stable. No bleeding issues noted.   Goal of Therapy:  Heparin level 0.3-0.7 units/ml Monitor platelets by anticoagulation protocol: Yes   Plan:   Continue IV heparin at 1450 units/hr   Daily heparin level and CBC  Monitor for signs/symptoms of bleeding  Follow up after cath  Erin Hearing PharmD., BCPS Clinical Pharmacist 07/06/2019 8:53 AM

## 2019-07-06 NOTE — Plan of Care (Signed)

## 2019-07-06 NOTE — Progress Notes (Signed)
 Progress Note  Patient Name: Ryan Owen Date of Encounter: 07/06/2019  Primary Cardiologist: No primary care provider on file.   Subjective   Further chest pain overnight.  Patient is fasting waiting for cardiac catheterization.  Inpatient Medications    Scheduled Meds: . aspirin EC  81 mg Oral Daily  . atorvastatin  20 mg Oral Daily  . Chlorhexidine Gluconate Cloth  6 each Topical Daily  . folic acid  1 mg Oral Daily  . mouth rinse  15 mL Mouth Rinse BID  . multivitamin with minerals  1 tablet Oral Daily  . sodium chloride flush  3 mL Intravenous Q12H  . thiamine  100 mg Oral Daily   Or  . thiamine  100 mg Intravenous Daily   Continuous Infusions: . sodium chloride    . sodium chloride 1 mL/kg/hr (07/06/19 0510)  . heparin 1,450 Units/hr (07/06/19 0739)   PRN Meds: sodium chloride, acetaminophen, LORazepam **OR** LORazepam, nitroGLYCERIN, ondansetron (ZOFRAN) IV, sodium chloride flush   Vital Signs    Vitals:   07/05/19 2005 07/05/19 2337 07/06/19 0400 07/06/19 0444  BP: 124/90 115/84  110/90  Pulse: 71 (!) 57 (!) 57 (!) 59  Resp: 19 15 13 13  Temp: 98 F (36.7 C) 98.1 F (36.7 C)  97.9 F (36.6 C)  TempSrc: Oral Oral  Oral  SpO2: 98% 97% 96% 99%  Weight:      Height:        Intake/Output Summary (Last 24 hours) at 07/06/2019 1010 Last data filed at 07/06/2019 0403 Gross per 24 hour  Intake 828.57 ml  Output 300 ml  Net 528.57 ml   Last 3 Weights 07/04/2019 07/03/2019 07/03/2019  Weight (lbs) 166 lb 3.6 oz 166 lb 10.7 oz 165 lb  Weight (kg) 75.4 kg 75.6 kg 74.844 kg      Telemetry    Sinus rhythm, rate 60-70s- Personally Reviewed  ECG    Sinus bradycardia, rate 49, no ST abnormalities- Personally Reviewed  Physical Exam   GEN: No acute distress.   Neck: No JVD Cardiac: bradycardia, no murmurs, rubs, or gallops.  Respiratory: Clear to auscultation bilaterally. GI: Soft, nontender, non-distended  MS: No edema; No deformity. Neuro:   Nonfocal  Psych: Normal affect   Labs    High Sensitivity Troponin:   Recent Labs  Lab 07/03/19 1218 07/03/19 1845 07/03/19 2255 07/04/19 0609 07/04/19 0945  TROPONINIHS 4,193* 5,873* 10,324* 11,980* 11,928*      Chemistry Recent Labs  Lab 07/04/19 0609 07/05/19 0158 07/06/19 0233  NA 136 138 138  K 3.7 3.5 3.8  CL 105 107 109  CO2 23 23 24  GLUCOSE 94 105* 109*  BUN 11 8 8  CREATININE 0.88 0.79 0.75  CALCIUM 8.9 8.5* 8.8*  PROT  --   --  6.4*  ALBUMIN  --   --  3.2*  AST  --   --  38  ALT  --   --  34  ALKPHOS  --   --  77  BILITOT  --   --  0.3  GFRNONAA >60 >60 >60  GFRAA >60 >60 >60  ANIONGAP 8 8 5     Hematology Recent Labs  Lab 07/04/19 0609 07/05/19 0158 07/06/19 0233  WBC 13.9* 10.7* 9.7  RBC 3.41* 3.46* 3.59*  HGB 11.3* 11.4* 11.9*  HCT 33.8* 33.5* 35.1*  MCV 99.1 96.8 97.8  MCH 33.1 32.9 33.1  MCHC 33.4 34.0 33.9  RDW 15.1 14.9 14.6  PLT   290 291 298    BNPNo results for input(s): BNP, PROBNP in the last 168 hours.   DDimer No results for input(s): DDIMER in the last 168 hours.   Radiology    No results found.  Cardiac Studies   TTE 07/03/19: 1. Left ventricular ejection fraction, by estimation, is 60 to 65%. The  left ventricle has normal function. The left ventricle has no regional  wall motion abnormalities. Left ventricular diastolic parameters were  normal.  2. Right ventricular systolic function is normal. The right ventricular  size is normal. Tricuspid regurgitation signal is inadequate for assessing  PA pressure.  3. Left atrial size was mildly dilated.  4. The mitral valve is normal in structure. Trivial mitral valve  regurgitation. No evidence of mitral stenosis.  5. The aortic valve is tricuspid. Aortic valve regurgitation is not  visualized. Mild aortic valve sclerosis is present, with no evidence of  aortic valve stenosis.  6. The inferior vena cava is normal in size with greater than 50%  respiratory  variability, suggesting right atrial pressure of 3 mmHg.   Patient Profile     54 y.o. male  with a a PMH of GERD, cocaine abuse, and tobacco abuse  who presents with NSTEMI  Assessment & Plan    1.  NSTEMI: patient presented with chest pain following cocaine use.  Currently chest pain-free.  Marked troponin elevation, up to 10,324.  TTE showed normal LV systolic function.  CTA C/A/P was without aortic aneurysm or dissection.  No known CAD history, though noted to have calcifications of coronary arteries on CTA C/A/P today. EKG shows no ischemic changes.  Risk factors for CAD include likely unmanaged HTN, hypertriglyceridemia, pre-diabetes, and tobacco abuse.   Utox positive for cocaine and THC.   -Continue aspirin 81 mg daily -Continue heparin drip -Continue atorvastatin, I will increase to 80 mg daily -No beta-blockers as he is bradycardic at baseline. -Plan for left heart catheterization today.  Risks and benefits of cardiac catheterization have been discussed with the patient.  These include bleeding, infection, kidney damage, stroke, heart attack, death.  The patient understands these risks and is willing to proceed.  2. Elevated BP:  Noted on admission, is now normotensive.  3. Mixed HLD: Triglycerides 276 this admission, LDL 84. He does have coronary artery calcifications on CTA chest so likely has some underlying CAD. Favor aggressive risk factor modifications -Started atorvastatin 20mg  daily, I will increase to 80 and add fish oil 1000 mg p.o. twice daily.  4. Pre-diabetes: A1C 5.7 - Encourage healthy dietary/lifestyle modifications to prevent progression  5. Tobacco abuse: smoking 1/2 ppd. Risks of continued use discussed - Continue to encourage cessation  6. Cocaine abuse: reports smoking ~once per week. Risks of continue use discussed - Continue to encourage abstinence  7. ETOH abuse: drinks 2 40oz beers a day. No history of withdrawals. Last drink yesterday evening.  -  Would monitor CIWA while admitted - will defer to primary team.  For questions or updates, please contact [facility] Please consult [email or web address] for contact info under     Signed, , MD  07/06/2019, 10:10 AM

## 2019-07-07 DIAGNOSIS — F1410 Cocaine abuse, uncomplicated: Secondary | ICD-10-CM

## 2019-07-07 LAB — CBC
HCT: 34.1 % — ABNORMAL LOW (ref 39.0–52.0)
Hemoglobin: 11.5 g/dL — ABNORMAL LOW (ref 13.0–17.0)
MCH: 33 pg (ref 26.0–34.0)
MCHC: 33.7 g/dL (ref 30.0–36.0)
MCV: 98 fL (ref 80.0–100.0)
Platelets: 289 10*3/uL (ref 150–400)
RBC: 3.48 MIL/uL — ABNORMAL LOW (ref 4.22–5.81)
RDW: 14.6 % (ref 11.5–15.5)
WBC: 8.9 10*3/uL (ref 4.0–10.5)
nRBC: 0 % (ref 0.0–0.2)

## 2019-07-07 LAB — BASIC METABOLIC PANEL
Anion gap: 9 (ref 5–15)
BUN: 8 mg/dL (ref 6–20)
CO2: 24 mmol/L (ref 22–32)
Calcium: 8.8 mg/dL — ABNORMAL LOW (ref 8.9–10.3)
Chloride: 108 mmol/L (ref 98–111)
Creatinine, Ser: 0.74 mg/dL (ref 0.61–1.24)
GFR calc Af Amer: >60 mL/min (ref >60)
GFR calc non Af Amer: >60 mL/min (ref >60)
Glucose, Bld: 102 mg/dL — ABNORMAL HIGH (ref 70–99)
Potassium: 4.1 mmol/L (ref 3.5–5.1)
Sodium: 141 mmol/L (ref 135–145)

## 2019-07-07 MED ORDER — ASPIRIN 81 MG PO TBEC: 81.0000 mg | DELAYED_RELEASE_TABLET | Freq: Every day | ORAL | 0 refills | Status: AC

## 2019-07-07 MED ORDER — ATORVASTATIN CALCIUM 80 MG PO TABS: 80.0000 mg | ORAL_TABLET | Freq: Every day | ORAL | 0 refills | Status: DC

## 2019-07-07 MED FILL — ATORVASTATIN CALCIUM 80 MG: 80 | 30 days supply | Qty: 30 | Fill #0

## 2019-07-07 MED FILL — ASPIRIN LOW DOSE 81 MG TBEC: 81 | 30 days supply | Qty: 30 | Fill #0

## 2019-07-07 NOTE — Plan of Care (Signed)

## 2019-07-07 NOTE — Progress Notes (Signed)
Pt d/c home. Discharge order given to pt, questions answered to pt satisfaction.

## 2019-07-07 NOTE — TOC Transition Note (Signed)
Transition of Care [facility]) - CM/SW Discharge Note   Patient Details  Name: Ryan Owen MRN: [id number] Date of Birth: 11/11/1965  [facility] CM/SW Contact:  Leone Haven, RN Phone Number: 07/07/2019, 10:19 AM   Clinical Narrative:    NCM spoke with patient, TOC will be filling his medications, and NCM assisting with [facility].  Patient states he will ask his ride to help him pay for his meds.  He has a follow up apt with [facility] also.    Final next level of care: Home/Self Care Barriers to Discharge: No Barriers Identified   Patient Goals and CMS Choice        Discharge Placement                       Discharge Plan and Services                                     Social Determinants of Health (SDOH) Interventions     Readmission Risk Interventions No flowsheet data found.

## 2019-07-07 NOTE — Discharge Summary (Signed)
Physician Discharge Summary  Ryan Owen ONG:[id number] DOB: 1965/12/19 DOA: 07/03/2019  PCP: Patient, No Pcp Per  Admit date: 07/03/2019 Discharge date: 07/07/2019  Admitted From: Home Disposition: Home   Recommendations for Outpatient Follow-up:  1. Follow up with PCP and cardiology as scheduled prior to discharge.  2. Patient started on aspirin and statin and fish oil, cocaine cessation counseling provided.  3. Needs work up of thyroid nodule incidentally noted which meets criteria for sampling. TSH normal.  Home Health: None Equipment/Devices: None Discharge Condition: Stable CODE STATUS: Full Diet recommendation: Heart healthy  Brief/Interim Summary: Ryan Owen is a 54 y.o. male with a history of tobacco use, alcohol and cocaine abuse, HLD and GERD who presented with chest pain after using cocaine. Troponin was elevated >10,000 without STEMI on ECG. Heparin IV was started and patient transferred to [facility] for LHC on 5/17 which revealed no significant coronary artery disease. Aspirin and statin were continued and cocaine cessation counseling was provided.  Discharge Diagnoses:  NSTEMI: Precipitated by cocaine use in patient with CAD based on coronary calcifications. No wall motion abnormalities on echocardiogram. Minimal disease in RCA on cath, normal filling pressures, no significant disease.  - Continue ASA, statin, fish oil.  - Not on beta blocker due to cocaine positivity and actually bradycardic at baseline.  Tobacco use:  - Nicotine patch  Cocaine use: +cocaine and THC on UDS. - Cessation counseling provided  Alcohol abuse: ~80oz beer/day, not currently withdrawing.  - Cessation/moderation counseling provided.   HTN:  - Continue medications as ordered.  HLD:  - Continue statin, check LFTs wnl  GERD:  - Continue PPI  Incidental thyroid nodule: Meets criteria for FNA on U/S. Multiple right-sided thyroid nodules, largest measuring up to 13 mm. - TSH  wnl - Defer work up to outpatient setting.   Discharge Instructions Discharge Instructions    Diet - low sodium heart healthy   Complete by: As directed    Discharge instructions   Complete by: As directed    Take aspirin and atorvastatin to reduce your risk of heart attacks and strokes in the future. Do not use cocaine. Follow up with your PCP in the next 2 weeks.   Increase activity slowly   Complete by: As directed      Allergies as of 07/07/2019      Reactions   Penicillins Anaphylaxis, Hives   Has patient had a PCN reaction causing immediate rash, facial/tongue/throat swelling, SOB or lightheadedness with hypotension: Yes Has patient had a PCN reaction causing severe rash involving mucus membranes or skin necrosis: Yes Has patient had a PCN reaction that required hospitalization: No Has patient had a PCN reaction occurring within the last 10 years: Yes If all of the above answers are "NO", then may proceed with Cephalosporin use.   Amoxicillin Hives      Medication List    TAKE these medications   aspirin 81 MG EC tablet Take 1 tablet (81 mg total) by mouth daily.   atorvastatin 80 MG tablet Commonly known as: LIPITOR Take 1 tablet (80 mg total) by mouth daily.   erythromycin ophthalmic ointment Place a 1/2 inch ribbon of ointment into the lower eyelid of the left eye 4 times a day for 5 days   naproxen sodium 220 MG tablet Commonly known as: ALEVE Take 440 mg by mouth 2 (two) times daily as needed (back pain).   NexIUM 24HR 20 MG Tbec Generic drug: Esomeprazole Magnesium Take 20 mg by mouth daily.  omeprazole 20 MG capsule Commonly known as: PRILOSEC Take 1 capsule (20 mg total) by mouth daily for 14 days.   tetrahydrozoline-zinc 0.05-0.25 % ophthalmic solution Commonly known as: VISINE-AC Place 2 drops into both eyes 3 (three) times daily as needed (red eyes dry eye).      Follow-up Information    Parke Poisson, MD Follow up on 08/13/2019.    Specialties: Cardiology, Radiology Why: @ 3:40PM Contact information: [address] [place] [address] [phone]        [facility] Follow up on 07/29/2019.   Why: 9:30 for hospital follow up Contact information: [address] [place] [address] [phone]         Allergies  Allergen Reactions  . Penicillins Anaphylaxis and Hives    Has patient had a PCN reaction causing immediate rash, facial/tongue/throat swelling, SOB or lightheadedness with hypotension: Yes Has patient had a PCN reaction causing severe rash involving mucus membranes or skin necrosis: Yes Has patient had a PCN reaction that required hospitalization: No Has patient had a PCN reaction occurring within the last 10 years: Yes If all of the above answers are "NO", then may proceed with Cephalosporin use.   Marland Kitchen Amoxicillin Hives    Consultations:  Cardiology  Procedures/Studies: DG Chest 2 View  Result Date: 07/03/2019 CLINICAL DATA:  Chest pain and shortness of breath EXAM: CHEST - 2 VIEW COMPARISON:  None. FINDINGS: Normal heart size and mediastinal contours. No acute infiltrate or edema. No effusion or pneumothorax. No acute osseous findings. IMPRESSION: No active cardiopulmonary disease. Electronically Signed   By: Marnee Spring M.D.   On: 07/03/2019 06:15   CARDIAC CATHETERIZATION  Result Date: 07/06/2019  Mid RCA lesion is 30% stenosed.  Ryan Owen is a 54 y.o. male  [id number] LOCATION:  FACILITY: [facility] PHYSICIAN: Nanetta Batty, M.D. [birth year] DATE OF PROCEDURE:  07/06/2019 DATE OF DISCHARGE: CARDIAC CATHETERIZATION History obtained from chart review.54 y.o. male with a a PMH of GERD, cocaine abuse, and tobacco abuse who presents with NSTEMI   Mr. Reimers has essentially normal coronary arteries and normal filling pressures.  I believe his "non-STEMI" is related to his cocaine and probably coronary vasospasm.  The sheath  was removed and a TR band was placed on the right wrist to achieve patent hemostasis.  The patient left lab stable condition.  Medical therapy will be recommended including counseling regarding illicit drug use. Nanetta Batty. MD, [facility] 07/06/2019 3:48 PM   ECHOCARDIOGRAM COMPLETE  Result Date: 07/03/2019    ECHOCARDIOGRAM REPORT   Patient Name:   NICHOLSON STARACE Date of Exam: 07/03/2019 Medical Rec #:  [id number]       Height:       72.0 in Accession #:    [id number]      Weight:       165.0 lb Date of Birth:  01/01/1966       BSA:          1.963 m Patient Age:    53 years        BP:           134/86 mmHg Patient Gender: M               HR:           55 bpm. Exam Location:  Inpatient Procedure: 2D Echo Indications:   Chest Pain 786.50 / R07.9  History:       Patient has no prior  history of Echocardiogram examinations.                NSTEMI.  Sonographer:   Leeroy Bockhelsea Turrentine Referring      [id number] Beatriz StallionKRISTA M. KROEGER Phys: IMPRESSIONS  1. Left ventricular ejection fraction, by estimation, is 60 to 65%. The left ventricle has normal function. The left ventricle has no regional wall motion abnormalities. Left ventricular diastolic parameters were normal.  2. Right ventricular systolic function is normal. The right ventricular size is normal. Tricuspid regurgitation signal is inadequate for assessing PA pressure.  3. Left atrial size was mildly dilated.  4. The mitral valve is normal in structure. Trivial mitral valve regurgitation. No evidence of mitral stenosis.  5. The aortic valve is tricuspid. Aortic valve regurgitation is not visualized. Mild aortic valve sclerosis is present, with no evidence of aortic valve stenosis.  6. The inferior vena cava is normal in size with greater than 50% respiratory variability, suggesting right atrial pressure of 3 mmHg. FINDINGS  Left Ventricle: Left ventricular ejection fraction, by estimation, is 60 to 65%. The left ventricle has normal function. The left ventricle has no  regional wall motion abnormalities. The left ventricular internal cavity size was normal in size. There is  no left ventricular hypertrophy. Left ventricular diastolic parameters were normal. Normal left ventricular filling pressure. Right Ventricle: The right ventricular size is normal. No increase in right ventricular wall thickness. Right ventricular systolic function is normal. Tricuspid regurgitation signal is inadequate for assessing PA pressure. Left Atrium: Left atrial size was mildly dilated. Right Atrium: Right atrial size was normal in size. Pericardium: There is no evidence of pericardial effusion. Mitral Valve: The mitral valve is normal in structure. Normal mobility of the mitral valve leaflets. Moderate mitral annular calcification. Trivial mitral valve regurgitation. No evidence of mitral valve stenosis. Tricuspid Valve: The tricuspid valve is normal in structure. Tricuspid valve regurgitation is mild . No evidence of tricuspid stenosis. Aortic Valve: The aortic valve is tricuspid. Aortic valve regurgitation is not visualized. Mild aortic valve sclerosis is present, with no evidence of aortic valve stenosis. Pulmonic Valve: The pulmonic valve was normal in structure. Pulmonic valve regurgitation is not visualized. No evidence of pulmonic stenosis. Aorta: The aortic root is normal in size and structure. Venous: The inferior vena cava is normal in size with greater than 50% respiratory variability, suggesting right atrial pressure of 3 mmHg. IAS/Shunts: No atrial level shunt detected by color flow Doppler.  LEFT VENTRICLE PLAX 2D LVIDd:         5.20 cm  Diastology LVIDs:         3.90 cm  LV e' lateral:   11.20 cm/s LV PW:         0.90 cm  LV E/e' lateral: 7.8 LV IVS:        0.90 cm  LV e' medial:    10.20 cm/s LVOT diam:     2.10 cm  LV E/e' medial:  8.6 LV SV:         82 LV SV Index:   42 LVOT Area:     3.46 cm  RIGHT VENTRICLE RV S prime:     11.70 cm/s TAPSE (M-mode): 2.8 cm LEFT ATRIUM              Index       RIGHT ATRIUM           Index LA diam:        4.25 cm 2.16 cm/m  RA Area:  18.20 cm LA Vol (A2C):   71.1 ml 36.21 ml/m RA Volume:   52.00 ml  26.49 ml/m LA Vol (A4C):   64.7 ml 32.95 ml/m LA Biplane Vol: 70.5 ml 35.91 ml/m  AORTIC VALVE LVOT Vmax:   111.00 cm/s LVOT Vmean:  76.900 cm/s LVOT VTI:    0.238 m  AORTA Ao Root diam: 3.20 cm MITRAL VALVE MV Area (PHT): 4.31 cm    SHUNTS MV Decel Time: 176 msec    Systemic VTI:  0.24 m MV E velocity: 87.40 cm/s  Systemic Diam: 2.10 cm MV A velocity: 76.70 cm/s MV E/A ratio:  1.14 Armanda Magic MD Electronically signed by Armanda Magic MD Signature Date/Time: 07/03/2019/1:54:27 PM    Final    US THYROID  Result Date: 07/04/2019 CLINICAL DATA:  Incidental on CT. Right-sided thyroid nodule demonstrated on chest CT performed 07/03/2019. EXAM: THYROID ULTRASOUND TECHNIQUE: Ultrasound examination of the thyroid gland and adjacent soft tissues was performed. COMPARISON:  Chest CT-07/03/2019 FINDINGS: Parenchymal Echotexture: Normal Isthmus: Normal in size measuring 0.9 cm in diameter Right lobe: Borderline enlarged measuring 5.9 x 2.3 x 2.5 cm Left lobe: Normal in size measuring 5.6 x 1.7 x 2.5 cm _________________________________________________________ Estimated total number of nodules >/= 1 cm: 1 Number of spongiform nodules >/=  2 cm not described below (TR1): 0 Number of mixed cystic and solid nodules >/= 1.5 cm not described below (TR2): 0 _________________________________________________________ Nodule # 1: Location: Right; Mid Maximum size: 1.6 cm; Other 2 dimensions: 1.1 x 1.0 cm Composition: solid/almost completely solid (2) Echogenicity: isoechoic (1) Shape: not taller-than-wide (0) Margins: lobulated/irregular (2) Echogenic foci: none (0) ACR TI-RADS total points: 5. ACR TI-RADS risk category: TR4 (4-6 points). ACR TI-RADS recommendations: **Given size (>/= 1.5 cm) and appearance, fine needle aspiration of this moderately suspicious nodule should  be considered based on TI-RADS criteria. _________________________________________________________ IMPRESSION: Solitary right-sided thyroid nodule meets imaging criteria to recommend percutaneous sampling as indicated. The above is in keeping with the ACR TI-RADS recommendations - J Am Coll Radiol 2017;14:587-595. Electronically Signed   By: Simonne Come M.D.   On: 07/04/2019 09:37   CT Angio Chest/Abd/Pel for Dissection W and/or Wo Contrast  Result Date: 07/03/2019 CLINICAL DATA:  Chest pain EXAM: CT ANGIOGRAPHY CHEST, ABDOMEN AND PELVIS TECHNIQUE: Non-contrast CT of the chest was initially obtained. Multidetector CT imaging through the chest, abdomen and pelvis was performed using the standard protocol during bolus administration of intravenous contrast. Multiplanar reconstructed images and MIPs were obtained and reviewed to evaluate the vascular anatomy. CONTRAST:  OMNIPAQUE IOHEXOL 350 MG/ML SOLN COMPARISON:  CT abdomen-pelvis 01/02/2007, same day chest x-ray FINDINGS: CTA CHEST FINDINGS Cardiovascular: Precontrast image reveals no evidence of intramural hematoma. Preferential opacification of the thoracic aorta. No evidence of thoracic aortic aneurysm or dissection. Scattered atherosclerotic calcification of the aorta and coronary arteries. Normal heart size. No pericardial effusion. Main pulmonary trunk is nondilated. Mediastinum/Nodes: Multiple right-sided thyroid nodules, largest measuring up to 13 mm inferiorly. No axillary, mediastinal, or hilar lymphadenopathy. Trachea and esophagus within normal limits. Lungs/Pleura: Small biapical blebs with mild pleuroparenchymal scarring. Minimal bibasilar subsegmental atelectasis. No focal airspace consolidation, pleural effusion, or pneumothorax. Musculoskeletal: Absence of the right pectoralis musculature. No acute or significant osseous findings. Review of the MIP images confirms the above findings. CTA ABDOMEN AND PELVIS FINDINGS VASCULAR Aorta: Normal  caliber aorta without aneurysm, dissection, vasculitis or significant stenosis. Minimal scattered atherosclerotic calcification. Celiac: Patent without evidence of aneurysm, dissection, vasculitis or significant stenosis. SMA: Patent without  evidence of aneurysm, dissection, vasculitis or significant stenosis. Renals: Single bilateral renal artery. Both renal arteries are patent without evidence of aneurysm, dissection, vasculitis, fibromuscular dysplasia or significant stenosis. IMA: Patent. Inflow: Patent without evidence of aneurysm, dissection, vasculitis or significant stenosis. Scattered atherosclerotic calcification. Veins: No obvious venous abnormality within the limitations of this arterial phase study. Review of the MIP images confirms the above findings. NON-VASCULAR Hepatobiliary: No focal liver abnormality is seen. No gallstones, gallbladder wall thickening, or biliary dilatation. Pancreas: Unremarkable. No pancreatic ductal dilatation or surrounding inflammatory changes. Spleen: Unremarkable. Adrenals/Urinary Tract: Normal adrenal glands. Kidneys enhance symmetrically. No focal lesion, stone, or hydronephrosis. Bilateral ureters and urinary bladder appear unremarkable. Stomach/Bowel: Stomach is within normal limits. Appendix appears normal. No evidence of bowel wall thickening, distention, or inflammatory changes. Lymphatic: No abdominopelvic lymphadenopathy. Reproductive: Prostate is unremarkable. Other: No free air, free fluid, or abdominal wall hernia. Musculoskeletal: Degenerative disc disease of L5-S1. No acute or significant osseous findings. Review of the MIP images confirms the above findings. IMPRESSION: 1. No evidence of aortic aneurysm or dissection. 2. No acute findings within the chest, abdomen or pelvis. 3. Multiple right-sided thyroid nodules, largest measuring up to 13 mm. Further evaluation with nonemergent thyroid ultrasound is recommended. (ref: J Am Coll Radiol. 2015 Feb;12(2):  143-50). 4. Aortic atherosclerosis. (ICD10-I70.0). 5. Absence of the right pectoralis musculature, which may be postsurgical or related to [place] syndrome. Electronically Signed   By: Davina Poke D.O.   On: 07/03/2019 10:05   Subjective: Feels well, no chest pain or dyspnea or other complaints.  Discharge Exam: Vitals:   07/07/19 0724 07/07/19 1038  BP: 135/75 122/74  Pulse: 65 69  Resp: 18 10  Temp: 97.7 F (36.5 C) 98.2 F (36.8 C)  SpO2: 98% 100%   General: Pt is alert, awake, not in acute distress Cardiovascular: RRR, S1/S2 +, no rubs, no gallops Respiratory: CTA bilaterally, no wheezing, no rhonchi Abdominal: Soft, NT, ND, bowel sounds + Extremities: No edema, no cyanosis  Labs: BNP (last 3 results) No results for input(s): BNP in the last 8760 hours. Basic Metabolic Panel: Recent Labs  Lab 07/03/19 0550 07/03/19 1250 07/04/19 0609 07/05/19 0158 07/06/19 0233 07/07/19 0222  NA 139  --  136 138 138 141  K 3.3*  --  3.7 3.5 3.8 4.1  CL 107  --  105 107 109 108  CO2 23  --  23 23 24 24   GLUCOSE 91  --  94 105* 109* 102*  BUN 7  --  11 8 8 8   CREATININE 0.79  --  0.88 0.79 0.75 0.74  CALCIUM 8.4*  --  8.9 8.5* 8.8* 8.8*  MG  --  1.9  --   --   --   --   PHOS  --  3.8  --   --   --   --    Liver Function Tests: Recent Labs  Lab 07/06/19 0233  AST 38  ALT 34  ALKPHOS 77  BILITOT 0.3  PROT 6.4*  ALBUMIN 3.2*   No results for input(s): LIPASE, AMYLASE in the last 168 hours. No results for input(s): AMMONIA in the last 168 hours. CBC: Recent Labs  Lab 07/03/19 0550 07/04/19 0609 07/05/19 0158 07/06/19 0233 07/07/19 0222  WBC 9.2 13.9* 10.7* 9.7 8.9  HGB 11.9* 11.3* 11.4* 11.9* 11.5*  HCT 35.2* 33.8* 33.5* 35.1* 34.1*  MCV 99.4 99.1 96.8 97.8 98.0  PLT 331 290 291 298 289   Cardiac Enzymes: No  results for input(s): CKTOTAL, CKMB, CKMBINDEX, TROPONINI in the last 168 hours. BNP: Invalid input(s): POCBNP CBG: No results for input(s): GLUCAP  in the last 168 hours. D-Dimer No results for input(s): DDIMER in the last 72 hours. Hgb A1c No results for input(s): HGBA1C in the last 72 hours. Lipid Profile No results for input(s): CHOL, HDL, LDLCALC, TRIG, CHOLHDL, LDLDIRECT in the last 72 hours. Thyroid function studies Recent Labs    07/06/19 0233  TSH 1.225   Anemia work up No results for input(s): VITAMINB12, FOLATE, FERRITIN, TIBC, IRON, RETICCTPCT in the last 72 hours. Urinalysis    Component Value Date/Time   COLORURINE STRAW (A) 10/24/2017 1729   APPEARANCEUR CLEAR 10/24/2017 1729   LABSPEC 1.013 10/24/2017 1729   PHURINE 5.0 10/24/2017 1729   GLUCOSEU NEGATIVE 10/24/2017 1729   HGBUR NEGATIVE 10/24/2017 1729   BILIRUBINUR NEGATIVE 10/24/2017 1729   KETONESUR NEGATIVE 10/24/2017 1729   PROTEINUR NEGATIVE 10/24/2017 1729   NITRITE NEGATIVE 10/24/2017 1729   LEUKOCYTESUR MODERATE (A) 10/24/2017 1729    Microbiology Recent Results (from the past 240 hour(s))  SARS Coronavirus 2 by RT PCR (hospital order, performed in [facility] hospital lab) Nasopharyngeal Nasopharyngeal Swab     Status: None   Collection Time: 07/03/19  7:15 AM   Specimen: Nasopharyngeal Swab  Result Value Ref Range Status   SARS Coronavirus 2 NEGATIVE NEGATIVE Final    Comment: (NOTE) SARS-CoV-2 target nucleic acids are NOT DETECTED. The SARS-CoV-2 RNA is generally detectable in upper and lower respiratory specimens during the acute phase of infection. The lowest concentration of SARS-CoV-2 viral copies this assay can detect is 250 copies / mL. A negative result does not preclude SARS-CoV-2 infection and should not be used as the sole basis for treatment or other patient management decisions.  A negative result may occur with improper specimen collection / handling, submission of specimen other than nasopharyngeal swab, presence of viral mutation(s) within the areas targeted by this assay, and inadequate number of viral copies (<250  copies / mL). A negative result must be combined with clinical observations, patient history, and epidemiological information. Fact Sheet for Patients:   BoilerBrush.com.cy Fact Sheet for Healthcare Providers: https://pope.com/ This test is not yet approved or cleared  by the [place] FDA and has been authorized for detection and/or diagnosis of SARS-CoV-2 by FDA under an Emergency Use Authorization (EUA).  This EUA will remain in effect (meaning this test can be used) for the duration of the COVID-19 declaration under Section 564(b)(1) of the Act, 21 U.S.C. section 360bbb-3(b)(1), unless the authorization is terminated or revoked sooner. Performed at [facility], [address], [place], [place] [address]   MRSA PCR Screening     Status: None   Collection Time: 07/03/19  6:37 PM   Specimen: Nasal Mucosa; Nasopharyngeal  Result Value Ref Range Status   MRSA by PCR NEGATIVE NEGATIVE Final    Comment:        The GeneXpert MRSA Assay (FDA approved for NASAL specimens only), is one component of a comprehensive MRSA colonization surveillance program. It is not intended to diagnose MRSA infection nor to guide or monitor treatment for MRSA infections. Performed at [facility], [address], [place], [place] [address]     Time coordinating discharge: Approximately 40 minutes  Tyrone Nine, MD  [facility] 07/07/2019, 5:47 PM

## 2019-07-07 NOTE — Discharge Instructions (Signed)
Also continue taking fish oil as you have at home.

## 2019-07-07 NOTE — Progress Notes (Addendum)
Progress Note  Patient Name: Ryan Owen Date of Encounter: 07/07/2019  Primary Cardiologist: Parke Poisson, MD   Subjective   LHC showed minimal disease in the RCA and normal filling pressures. Cath site right radial looks stable. Patient denies chest pain or sob.    Inpatient Medications    Scheduled Meds: . aspirin EC  81 mg Oral Daily  . atorvastatin  80 mg Oral Daily  . pantoprazole  40 mg Oral Daily  . sodium chloride flush  3 mL Intravenous Q12H  . thiamine  100 mg Oral Daily   Continuous Infusions: . sodium chloride     PRN Meds: sodium chloride, acetaminophen, morphine injection, ondansetron (ZOFRAN) IV, sodium chloride flush   Vital Signs    Vitals:   07/06/19 2042 07/06/19 2312 07/07/19 0408 07/07/19 0724  BP: 139/90   135/75  Pulse: 65   65  Resp: 16   18  Temp: 98.1 F (36.7 C) 98 F (36.7 C) 98 F (36.7 C) 97.7 F (36.5 C)  TempSrc: Oral Oral Oral Oral  SpO2: 97%   98%  Weight:      Height:        Intake/Output Summary (Last 24 hours) at 07/07/2019 0832 Last data filed at 07/07/2019 0800 Gross per 24 hour  Intake 2135.69 ml  Output 1750 ml  Net 385.69 ml   Last 3 Weights 07/04/2019 07/03/2019 07/03/2019  Weight (lbs) 166 lb 3.6 oz 166 lb 10.7 oz 165 lb  Weight (kg) 75.4 kg 75.6 kg 74.844 kg      Telemetry    NSR HR 60-70s - Personally Reviewed  ECG     No new- Personally Reviewed  Physical Exam   GEN: No acute distress.   Neck: No JVD Cardiac: RRR, no murmurs, rubs, or gallops.  Respiratory: Clear to auscultation bilaterally. GI: Soft, nontender, non-distended  MS: No edema; No deformity. Neuro:  Nonfocal  Psych: Normal affect   Labs    High Sensitivity Troponin:   Recent Labs  Lab 07/03/19 1218 07/03/19 1845 07/03/19 2255 07/04/19 0609 07/04/19 0945  TROPONINIHS 4,193* 5,873* 10,324* 11,980* 11,928*      Chemistry Recent Labs  Lab 07/05/19 0158 07/06/19 0233 07/07/19 0222  NA 138 138 141  K 3.5 3.8 4.1   CL 107 109 108  CO2 23 24 24   GLUCOSE 105* 109* 102*  BUN 8 8 8   CREATININE 0.79 0.75 0.74  CALCIUM 8.5* 8.8* 8.8*  PROT  --  6.4*  --   ALBUMIN  --  3.2*  --   AST  --  38  --   ALT  --  34  --   ALKPHOS  --  77  --   BILITOT  --  0.3  --   GFRNONAA >60 >60 >60  GFRAA >60 >60 >60  ANIONGAP 8 5 9      Hematology Recent Labs  Lab 07/05/19 0158 07/06/19 0233 07/07/19 0222  WBC 10.7* 9.7 8.9  RBC 3.46* 3.59* 3.48*  HGB 11.4* 11.9* 11.5*  HCT 33.5* 35.1* 34.1*  MCV 96.8 97.8 98.0  MCH 32.9 33.1 33.0  MCHC 34.0 33.9 33.7  RDW 14.9 14.6 14.6  PLT 291 298 289    BNPNo results for input(s): BNP, PROBNP in the last 168 hours.   DDimer No results for input(s): DDIMER in the last 168 hours.   Radiology    CARDIAC CATHETERIZATION  Result Date: 07/06/2019  Mid RCA lesion is 30% stenosed.  07/08/19  is a 54 y.o. male  [id number] LOCATION:  FACILITY: [facility] PHYSICIAN: Quay Burow, M.D. 02-01-1966 DATE OF PROCEDURE:  07/06/2019 DATE OF DISCHARGE: CARDIAC CATHETERIZATION History obtained from chart review.54 y.o. male with a a PMH of GERD, cocaine abuse, and tobacco abuse who presents with NSTEMI   [name] has essentially normal coronary arteries and normal filling pressures.  I believe his "non-STEMI" is related to his cocaine and probably coronary vasospasm.  The sheath was removed and a TR band was placed on the right wrist to achieve patent hemostasis.  The patient left lab stable condition.  Medical therapy will be recommended including counseling regarding illicit drug use. Quay Burow. MD, [place] 07/06/2019 3:48 PM    Cardiac Studies   LHC 07/06/19   Mid RCA lesion is 30% stenosed.   IMPRESSION: [name] has essentially normal coronary arteries and normal filling pressures.  I believe his "non-STEMI" is related to his cocaine and probably coronary vasospasm.  The sheath was removed and a TR band was placed on the right wrist to achieve patent hemostasis.   The patient left lab stable condition.  Medical therapy will be recommended including counseling regarding illicit drug use.  Echo 07/03/19 1. Left ventricular ejection fraction, by estimation, is 60 to 65%. The  left ventricle has normal function. The left ventricle has no regional  wall motion abnormalities. Left ventricular diastolic parameters were  normal.  2. Right ventricular systolic function is normal. The right ventricular  size is normal. Tricuspid regurgitation signal is inadequate for assessing  PA pressure.  3. Left atrial size was mildly dilated.  4. The mitral valve is normal in structure. Trivial mitral valve  regurgitation. No evidence of mitral stenosis.  5. The aortic valve is tricuspid. Aortic valve regurgitation is not  visualized. Mild aortic valve sclerosis is present, with no evidence of  aortic valve stenosis.  6. The inferior vena cava is normal in size with greater than 50%  respiratory variability, suggesting right atrial pressure of 3 mmHg.   Patient Profile     54 y.o. male with a a PMH of GERD, cocaine abuse, and tobacco abuse who presents with NSTEMI  Assessment & Plan    NSTEMI Patient presented with chest pain following cocaine use. HS troponin up to 10,324. TTE showed normal LV function. CTA chest/abd/pelvid negative for dissection and aortic aneurysm, but did note coronary calcifications. EKG with no ischemic changes. RF for Cad include HTN, hypertriglyceridemia, prediabetes, and tobacco use. UDS positive for cocaine - continue aspirin, atorvastatin - No BB with cocaine use - LHC showed 30% stenosis of mid RCA, essentially normal coronaries and normal filling pressures. Likely chest pain is from cocain use and vasospasm.  - cath site right wrist overall stable with minimal tenderness - creatinine 0.74 and Hgb 11.5 - likely discharge today  HTN - normotensive, 135/57 this AM  Mixed HLD - atorvastatin increased to 80 mg daily - LDL 84 and  TG 276  Pre-diabetes - A1C 5.7  Substance abuse - he reports cocaine use once a week and smokes 1/2 ppd. He drinks 240oz beers daily - Discussed cessation with the patient  For questions or updates, please contact [place] Please consult [email or web address] for contact info under     Signed, Cadence Ninfa Meeker, PA-C  07/07/2019, 8:32 AM    The patient was seen, examined and discussed with Cadence Ninfa Meeker, PA-C   and I agree with the above.   54 year old gentleman with history of hypertension  hyperlipidemia prediabetes and significant history of substance abuse including cocaine and was admitted with non-STEMI with significant troponin elevation up to 11,000 now downtrending with only minimal nonobstructive CAD on cath yesterday and normal filling pressures. I would continue current regimen with aspirin, atorvastatin 80 mg daily, I will add fish oil 1000 mg p.o. twice daily can be over-the-counter on discharge, vitals stable, he is stable to be discharged from cardiac standpoint will sign off and arrange for outpatient follow-up.  Creatinine is stable post cath, right radial access site has no bleeding or bruit.  Tobias Alexander, MD 07/07/2019

## 2019-07-29 ENCOUNTER — Other Ambulatory Visit: Payer: Self-pay

## 2019-07-29 ENCOUNTER — Ambulatory Visit: Payer: Self-pay | Attending: Nurse Practitioner | Admitting: Nurse Practitioner

## 2019-08-13 ENCOUNTER — Ambulatory Visit: Payer: Self-pay | Admitting: Internal Medicine

## 2019-08-28 ENCOUNTER — Encounter: Payer: Self-pay | Admitting: Nurse Practitioner

## 2019-08-28 ENCOUNTER — Other Ambulatory Visit: Payer: Self-pay

## 2019-08-28 ENCOUNTER — Ambulatory Visit: Payer: Self-pay | Attending: Nurse Practitioner | Admitting: Nurse Practitioner

## 2019-08-28 VITALS — Ht 72.0 in | Wt 175.0 lb

## 2019-08-28 DIAGNOSIS — I214 Non-ST elevation (NSTEMI) myocardial infarction: Secondary | ICD-10-CM

## 2019-08-28 DIAGNOSIS — K219 Gastro-esophageal reflux disease without esophagitis: Secondary | ICD-10-CM

## 2019-08-28 DIAGNOSIS — Z7689 Persons encountering health services in other specified circumstances: Secondary | ICD-10-CM

## 2019-08-28 DIAGNOSIS — F17200 Nicotine dependence, unspecified, uncomplicated: Secondary | ICD-10-CM

## 2019-08-28 MED ORDER — ATORVASTATIN CALCIUM 80 MG PO TABS: 80.0000 mg | ORAL_TABLET | Freq: Every day | ORAL | 1 refills | Status: AC

## 2019-08-28 MED ORDER — OMEPRAZOLE 20 MG PO CPDR: 20.0000 mg | DELAYED_RELEASE_CAPSULE | Freq: Every day | ORAL | 0 refills | Status: DC

## 2019-08-28 MED FILL — OMEPRAZOLE 20 MG CAP: 20 | 30 days supply | Qty: 30 | Fill #0

## 2019-08-28 MED FILL — ATORVASTATIN 80 MG TABLET: 80 | 30 days supply | Qty: 30 | Fill #0

## 2019-08-28 NOTE — Progress Notes (Signed)
Virtual Visit via Telephone Note Due to national recommendations of social distancing due to COVID 19, telehealth visit is felt to be most appropriate for this patient at this time.  I discussed the limitations, risks, security and privacy concerns of performing an evaluation and management service by telephone and the availability of in person appointments. I also discussed with the patient that there may be a patient responsible charge related to this service. The patient expressed understanding and agreed to proceed.    I connected with Ryan Owen on 08/28/19  at   1:30 PM EDT  EDT by telephone and verified that I am speaking with the correct person using two identifiers.   Consent I discussed the limitations, risks, security and privacy concerns of performing an evaluation and management service by telephone and the availability of in person appointments. I also discussed with the patient that there may be a patient responsible charge related to this service. The patient expressed understanding and agreed to proceed.   Location of Patient: Private  Residence    Location of Provider: [facility]    Persons participating in Telemedicine visit: Ryan Denver FNP-BC YY [place] CMA Ryan Owen    History of Present Illness: Telemedicine visit for: Establish Care  has a past medical history of substance abuse (alcohol and cocaine), non-STEMI, tobacco dependence, GERD (gastroesophageal reflux disease), Mixed hyperlipidemia, and Prediabetes.  Patient has been counseled on age-appropriate routine health concerns for screening and prevention. These are reviewed and up-to-date. Referrals have been placed accordingly. Immunizations are up-to-date or declined.     COLONOSCOPY: Patient states he had a colonoscopy a year or 2 ago however I am unable to find any evidence of this in epic.  I also asked him who his gastroenterologist was and he states he is outside  and "I can't remember all the stuff right now ".    Does not have any questions or concerns today. He has a history of NSTEMI  and was admitted to the hospital on 5-14 with chest pain after using cocaine.  Left heart cath on 517 revealed no significant coronary artery disease and he was started on high intensity statin and aspirin and counseled on cocaine cessation counseling.  There were incidental right-sided thyroid nodules seen on imaging.  Largest size measuring up to 13 mm.  TSH within normal limits and it was recommended that he be followed up with an ultrasound.   Today patient states he is outside and is rushing me through the televisit.  Requesting for [place] to call him back next week. He missed his appointment with cardiology a few weeks ago.  He is currently uninsured. Patient has been advised to apply for financial assistance and schedule to see our financial counselor.  Denies chest pain, shortness of breath, palpitations, lightheadedness, dizziness, headaches or BLE edema.   Past Medical History:  Diagnosis Date  . GERD (gastroesophageal reflux disease)   . Mixed hyperlipidemia   . Prediabetes     Past Surgical History:  Procedure Laterality Date  . LEFT HEART CATH AND CORONARY ANGIOGRAPHY N/A 07/06/2019   Procedure: LEFT HEART CATH AND CORONARY ANGIOGRAPHY;  Surgeon: Runell Gess, MD;  Location: [facility];  Service: Cardiovascular;  Laterality: N/A;    Family History  Problem Relation Age of Onset  . CAD Neg Hx   . Heart failure Neg Hx   . Hypertension Neg Hx   . Diabetes Neg Hx     Social History  Socioeconomic History  . Marital status: Single    Spouse name: Not on file  . Number of children: Not on file  . Years of education: Not on file  . Highest education level: Not on file  Occupational History  . Not on file  Tobacco Use  . Smoking status: Current Every Day Smoker    Packs/day: 0.50    Years: 20.00    Pack years: 10.00    Types: Cigarettes   . Smokeless tobacco: Never Used  Vaping Use  . Vaping Use: Never used  Substance and Sexual Activity  . Alcohol use: Yes    Alcohol/week: 18.0 standard drinks    Types: 18 Cans of beer per week    Comment: 2 40oz beers per day  . Drug use: Not Currently    Frequency: 0.2 times per week    Types: Marijuana, Cocaine  . Sexual activity: Not Currently  Other Topics Concern  . Not on file  Social History Narrative  . Not on file   Social Determinants of Health   Financial Resource Strain:   . Difficulty of Paying Living Expenses:   Food Insecurity:   . Worried About Programme researcher, broadcasting/film/video in the Last Year:   . Barista in the Last Year:   Transportation Needs:   . Freight forwarder (Medical):   Marland Kitchen Lack of Transportation (Non-Medical):   Physical Activity:   . Days of Exercise per Week:   . Minutes of Exercise per Session:   Stress:   . Feeling of Stress :   Social Connections:   . Frequency of Communication with Friends and Family:   . Frequency of Social Gatherings with Friends and Family:   . Attends Religious Services:   . Active Member of Clubs or Organizations:   . Attends Banker Meetings:   Marland Kitchen Marital Status:      Observations/Objective: Awake, alert and oriented x 3   Review of Systems  Constitutional: Negative for fever, malaise/fatigue and weight loss.  HENT: Negative.  Negative for nosebleeds.   Eyes: Negative.  Negative for blurred vision, double vision and photophobia.  Respiratory: Negative.  Negative for cough and shortness of breath.   Cardiovascular: Negative.  Negative for chest pain, palpitations and leg swelling.  Gastrointestinal: Positive for heartburn. Negative for nausea and vomiting.  Musculoskeletal: Negative.  Negative for myalgias.  Neurological: Negative.  Negative for dizziness, focal weakness, seizures and headaches.  Psychiatric/Behavioral: Negative.  Negative for suicidal ideas.    Assessment and Plan: Ryan Owen  was seen today for new patient (initial visit).  Diagnoses and all orders for this visit:  Encounter to establish care  NSTEMI (non-ST elevated myocardial infarction) (HCC) -     atorvastatin (LIPITOR) 80 MG tablet; Take 1 tablet (80 mg total) by mouth daily.  Tobacco dependence Ryan Owen was counseled on the dangers of tobacco use, and was advised to quit. Reviewed strategies to maximize success, including removing cigarettes and smoking materials from environment, stress management and support of family/friends as well as pharmacological alternatives including: Wellbutrin, Chantix, Nicotine patch, Nicotine gum or lozenges. Smoking cessation support: smoking cessation hotline: 1-800-QUIT-NOW.  Smoking cessation classes are also available through [facility]. Call [phone] or visit our website at [email or web address].   A total of 2 minutes was spent on counseling for smoking cessation and Ryan Owen is not ready to quit.   GERD without esophagitis -     omeprazole (PRILOSEC) 20 MG capsule;  Take 1 capsule (20 mg total) by mouth daily. INSTRUCTIONS: Avoid GERD Triggers: acidic, spicy or fried foods, caffeine, coffee, sodas,  alcohol and chocolate.    Follow Up Instructions Return in about 6 weeks (around 10/09/2019).     I discussed the assessment and treatment plan with the patient. The patient was provided an opportunity to ask questions and all were answered. The patient agreed with the plan and demonstrated an understanding of the instructions.   The patient was advised to call back or seek an in-person evaluation if the symptoms worsen or if the condition fails to improve as anticipated.  I provided 11 minutes of non-face-to-face time during this encounter including median intraservice time, reviewing previous notes, labs, imaging, medications and explaining diagnosis and management.  Ryan Rigg, FNP-BC

## 2019-09-02 ENCOUNTER — Other Ambulatory Visit: Payer: Self-pay | Admitting: Nurse Practitioner

## 2019-09-02 DIAGNOSIS — Z1211 Encounter for screening for malignant neoplasm of colon: Secondary | ICD-10-CM

## 2019-09-02 DIAGNOSIS — Z125 Encounter for screening for malignant neoplasm of prostate: Secondary | ICD-10-CM

## 2019-09-02 DIAGNOSIS — Z130 Encounter for screening for diseases of the blood and blood-forming organs and certain disorders involving the immune mechanism: Secondary | ICD-10-CM

## 2019-09-03 ENCOUNTER — Other Ambulatory Visit: Payer: Self-pay

## 2019-10-09 ENCOUNTER — Encounter: Payer: Self-pay | Admitting: Nurse Practitioner

## 2021-02-05 ENCOUNTER — Other Ambulatory Visit: Payer: Self-pay

## 2021-02-05 ENCOUNTER — Ambulatory Visit (HOSPITAL_COMMUNITY)
Admission: EM | Admit: 2021-02-05 | Discharge: 2021-02-05 | Disposition: A | Discharge status: Home / Self Care | Payer: Worker's Compensation | Attending: Family Medicine | Admitting: Family Medicine

## 2021-02-05 ENCOUNTER — Encounter (HOSPITAL_COMMUNITY): Payer: Self-pay

## 2021-02-05 DIAGNOSIS — M545 Low back pain, unspecified: Secondary | ICD-10-CM | POA: Diagnosis not present

## 2021-02-05 DIAGNOSIS — M542 Cervicalgia: Secondary | ICD-10-CM

## 2021-02-05 DIAGNOSIS — K219 Gastro-esophageal reflux disease without esophagitis: Secondary | ICD-10-CM | POA: Diagnosis not present

## 2021-02-05 DIAGNOSIS — R1084 Generalized abdominal pain: Secondary | ICD-10-CM

## 2021-02-05 MED ORDER — OMEPRAZOLE 20 MG PO CPDR: 20.0000 mg | DELAYED_RELEASE_CAPSULE | Freq: Every day | ORAL | 0 refills | Status: AC

## 2021-02-05 MED ORDER — CELECOXIB 200 MG PO CAPS: 200.0000 mg | ORAL_CAPSULE | Freq: Every day | ORAL | 0 refills | Status: AC | PRN
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
## 2021-02-05 NOTE — Discharge Instructions (Signed)
Take celecoxib 1 daily for pain. You may also take tylenol for pain.  Omeprazole is for your stomach, take 1 daily.

## 2021-02-22 IMAGING — CR DG CHEST 2V
3 series · 3 of 3 positions shown · non-contrast
Comparison: None.

CLINICAL DATA: Chest pain and shortness of breath

EXAM:
CHEST - 2 VIEW

[w chest lat]
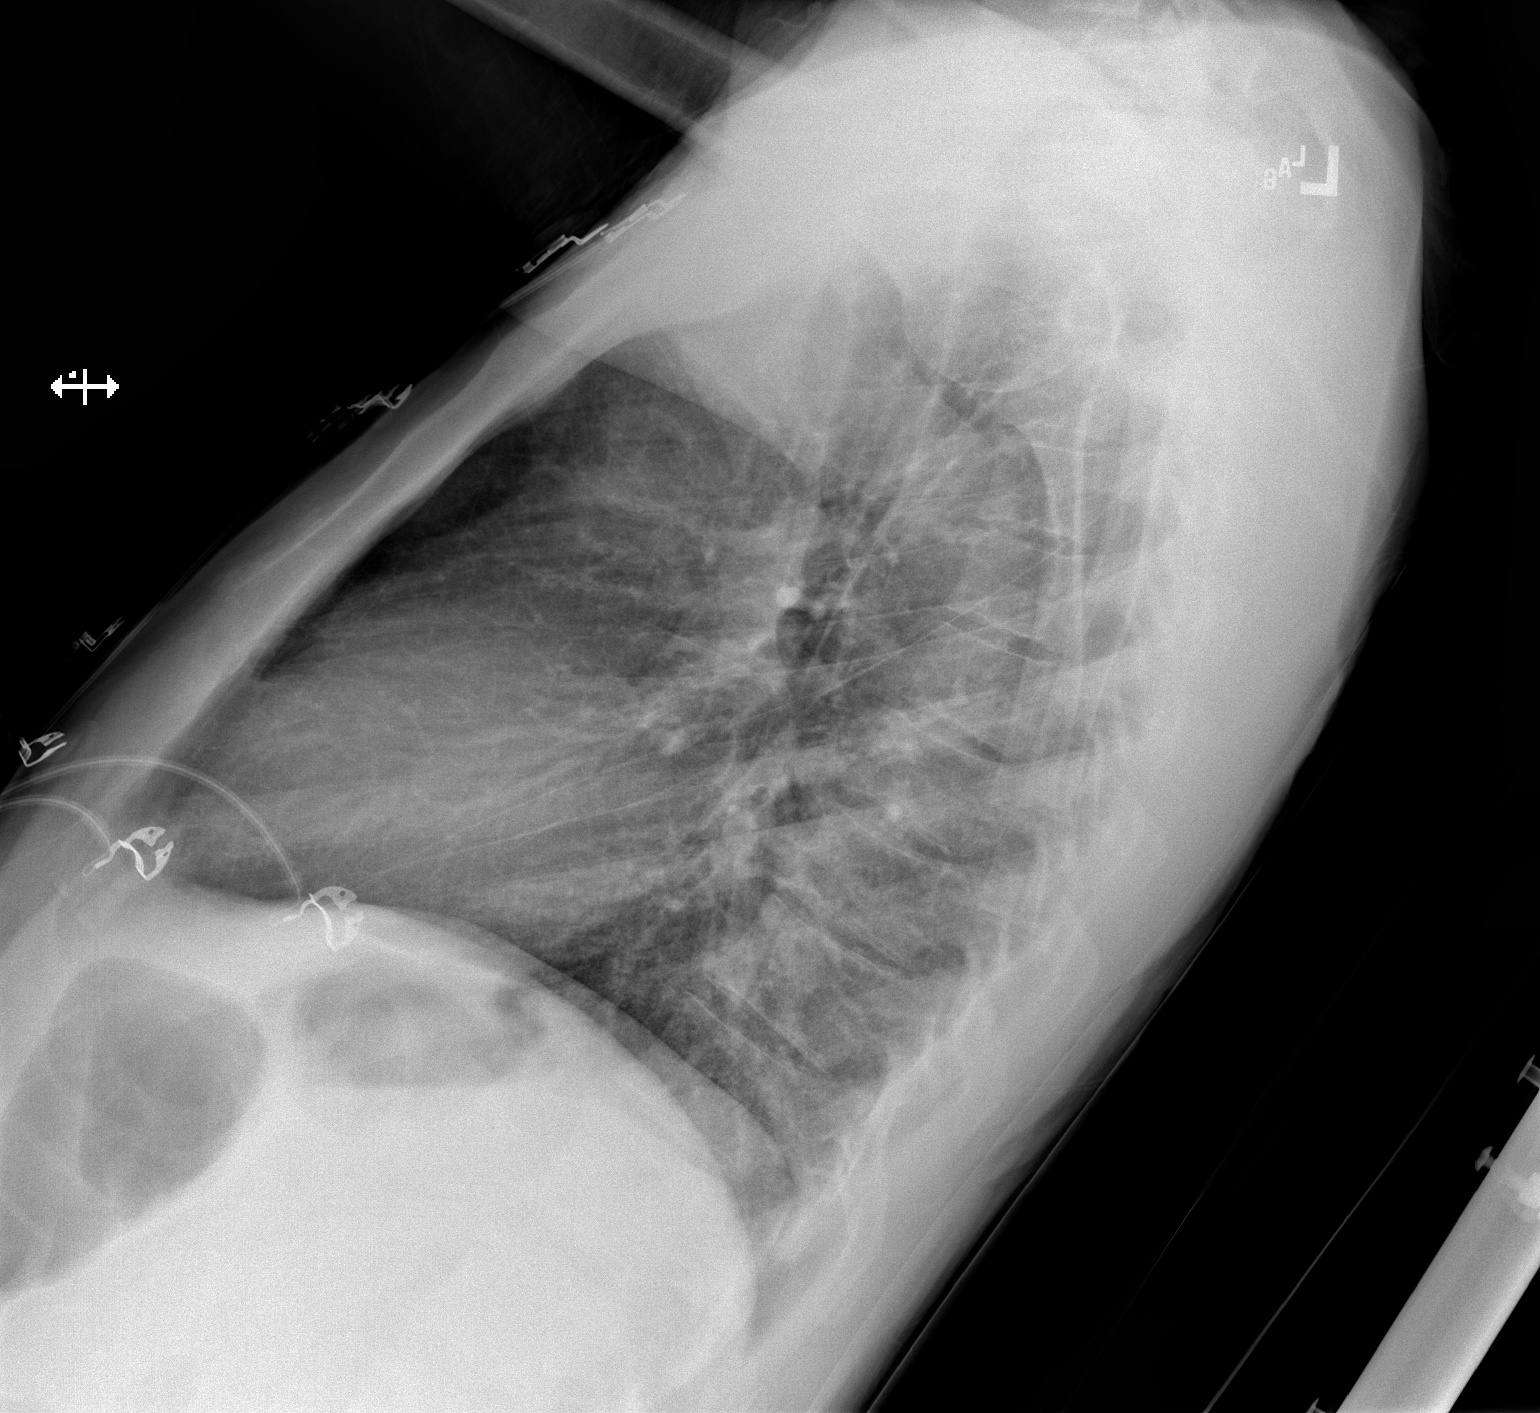

[x chest ap (1 of 2)]
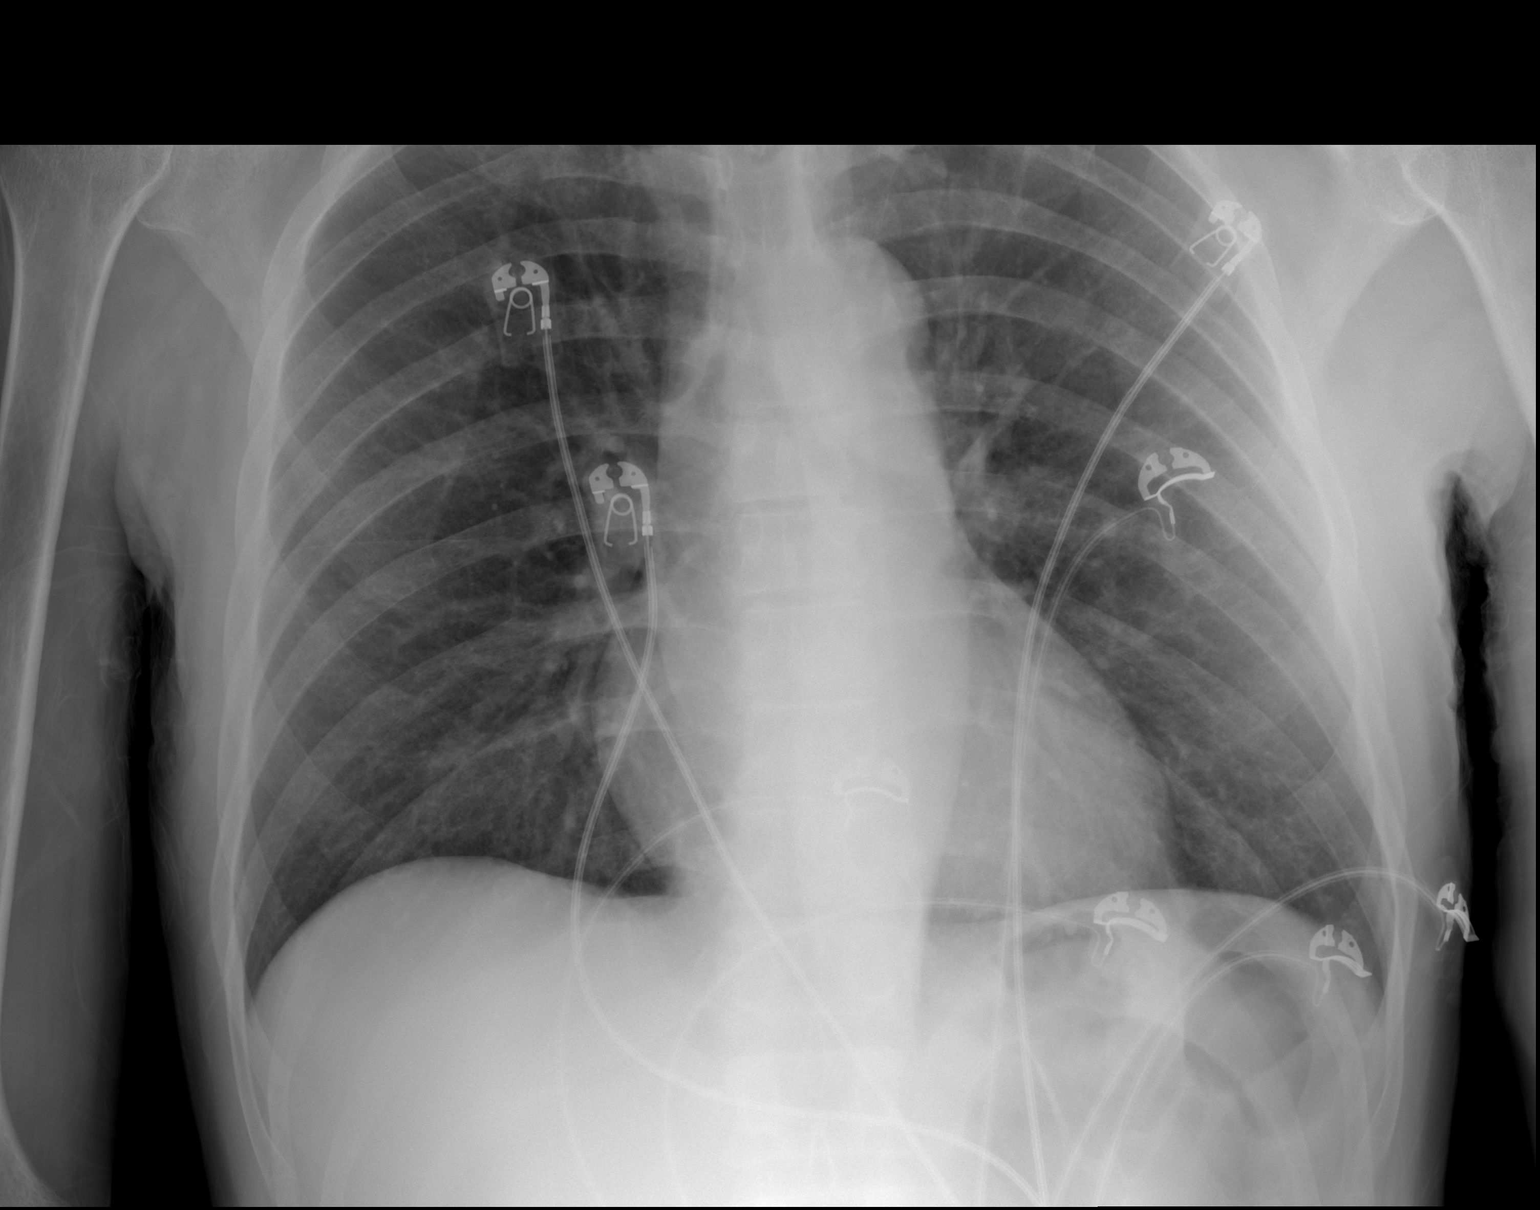

[x chest ap (2 of 2)]
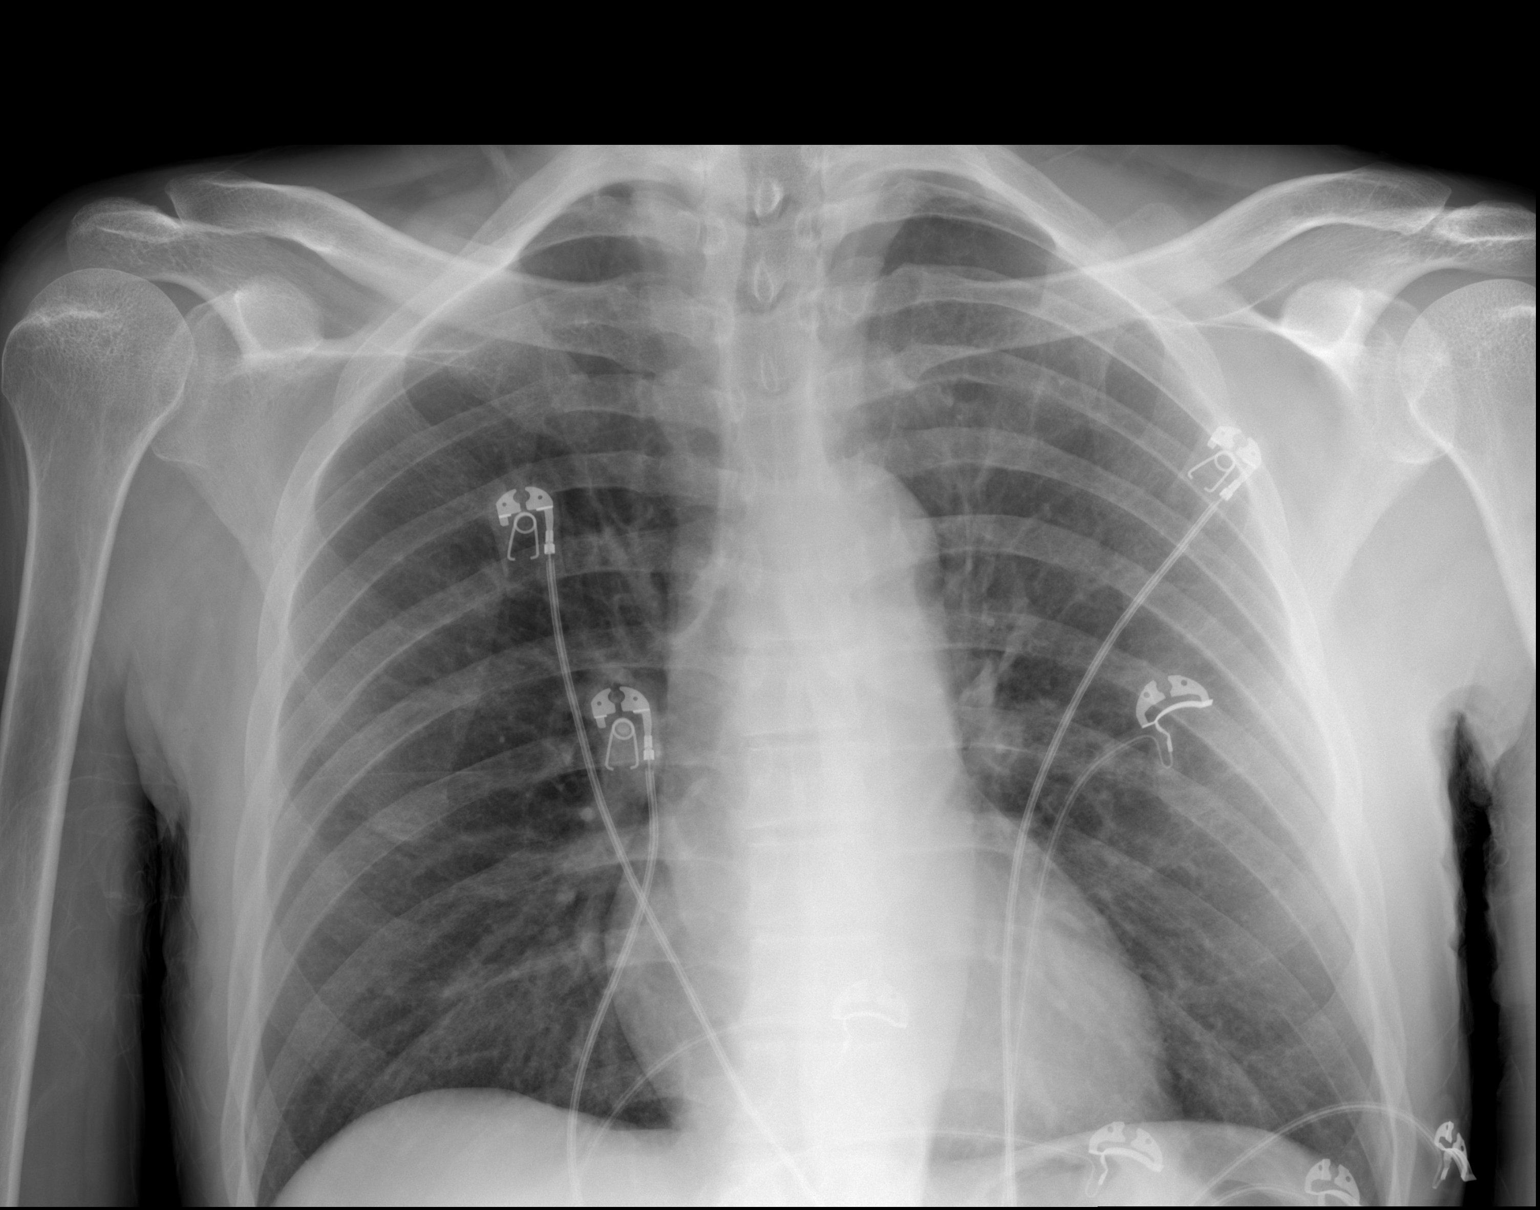

[3 of 3 positions shown; findings below may reference images not displayed]

FINDINGS: Normal heart size and mediastinal contours. No acute infiltrate or
edema. No effusion or pneumothorax. No acute osseous findings.
IMPRESSION: No active cardiopulmonary disease.

## 2021-02-23 IMAGING — US US THYROID
2 series · 13 of 25 positions shown · non-contrast
Comparison: Chest CT-07/03/2019

CLINICAL DATA: Incidental on CT. Right-sided thyroid nodule
demonstrated on chest CT performed 07/03/2019.

EXAM:
THYROID ULTRASOUND
TECHNIQUE: Ultrasound examination of the thyroid gland and adjacent soft
tissues was performed.

[Series 1: us thyroid · 41 acquisitions, 12 frames shown (1 of 2)]
[im 1/41]
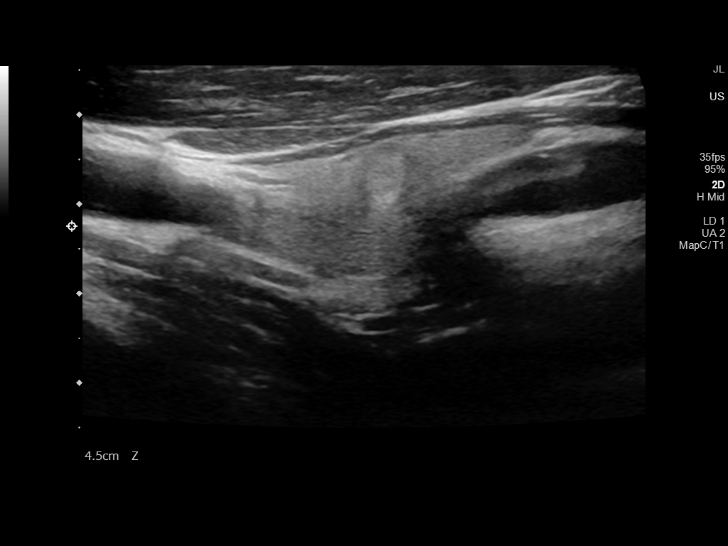
[im 4/41]
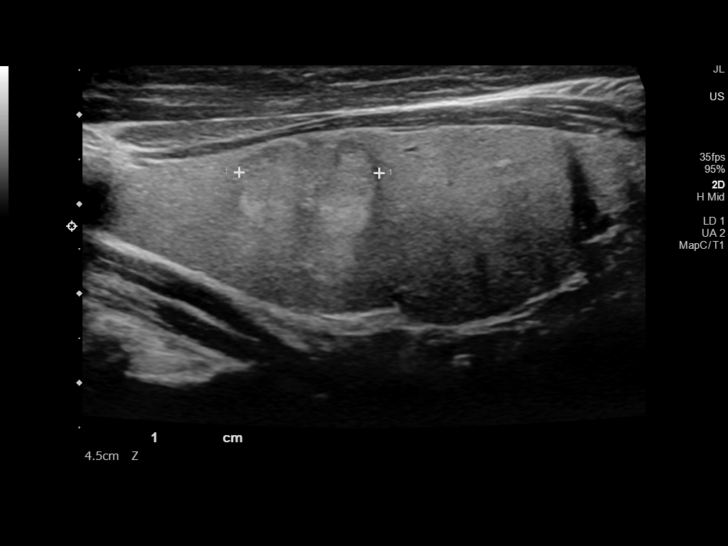
[im 7/41]
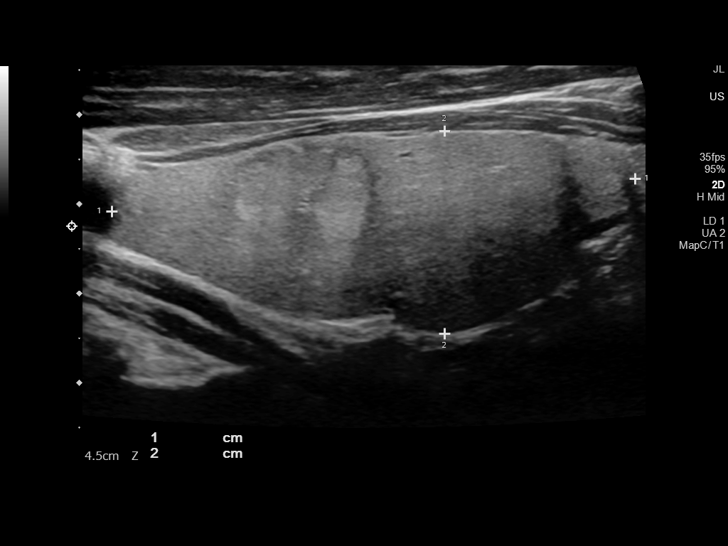
[im 11/41]
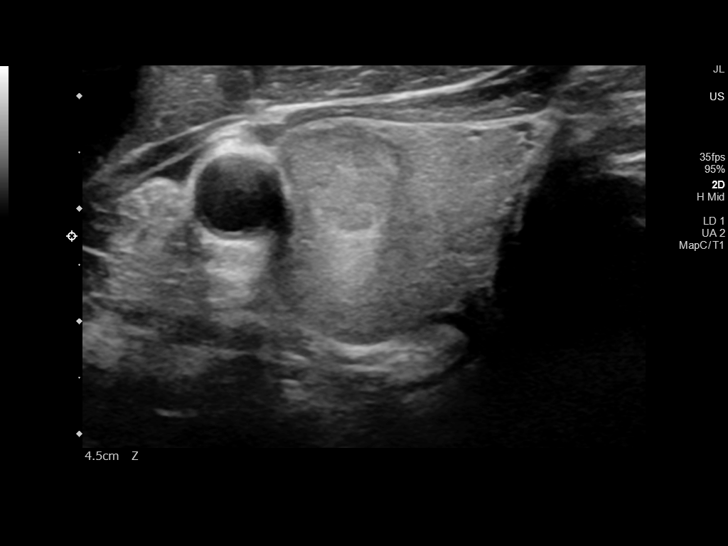
[im 14/41]
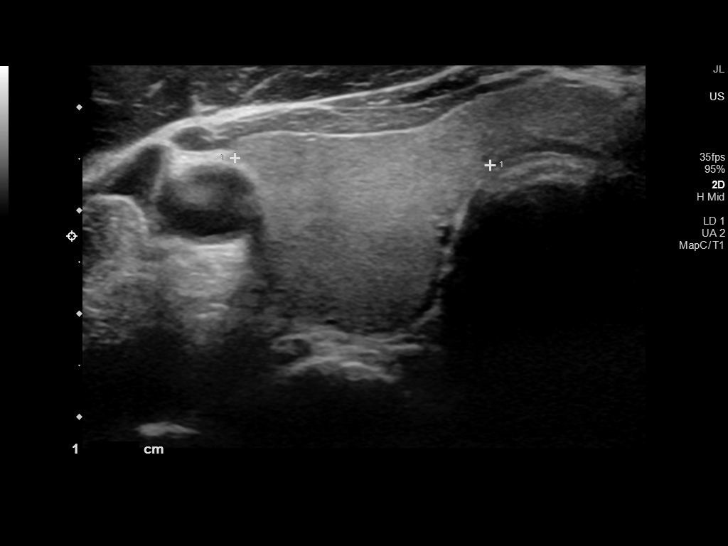
[im 18/41]
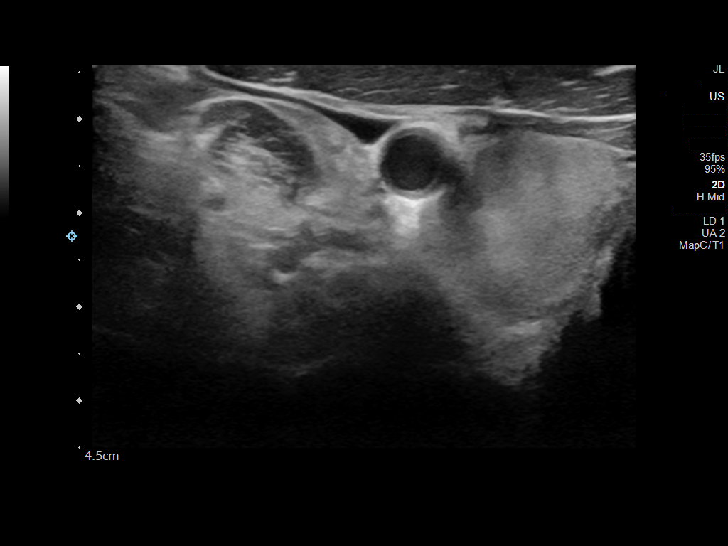
[im 21/41]
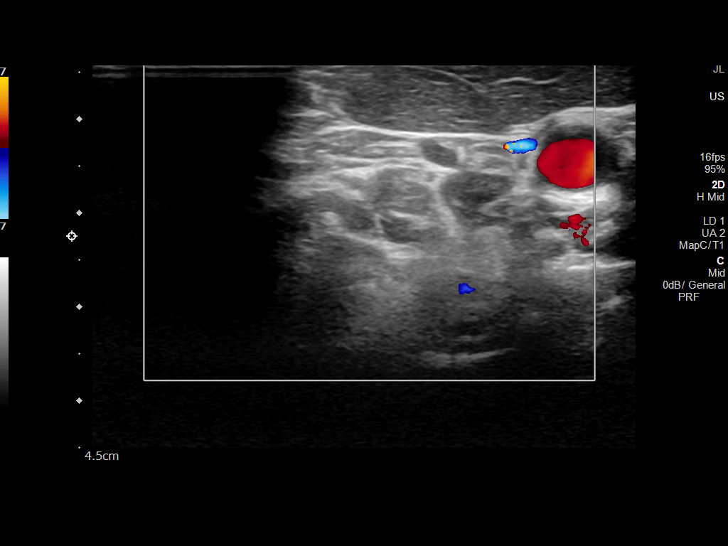
[im 25/41]
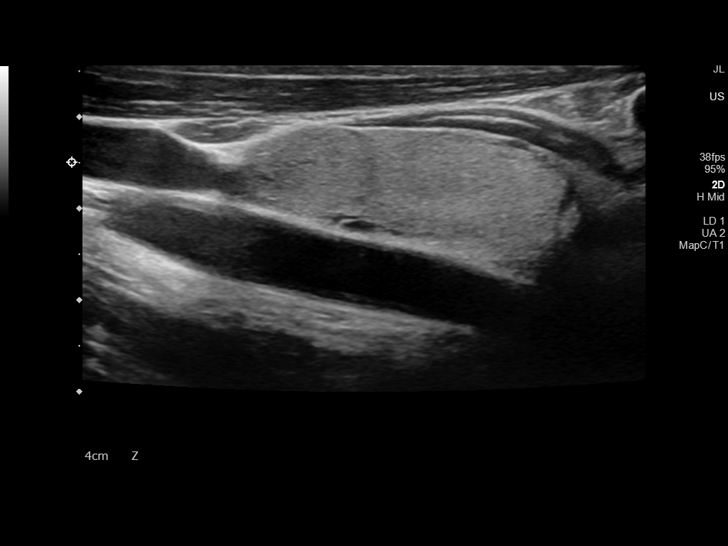
[im 28/41]
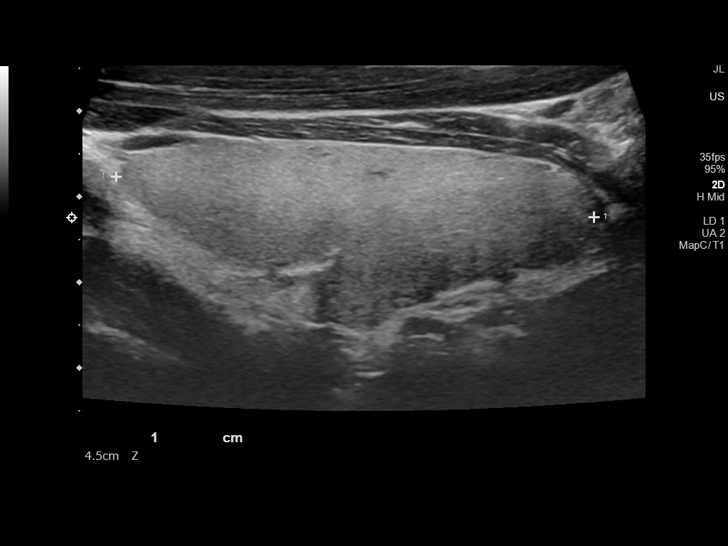
[im 32/41]
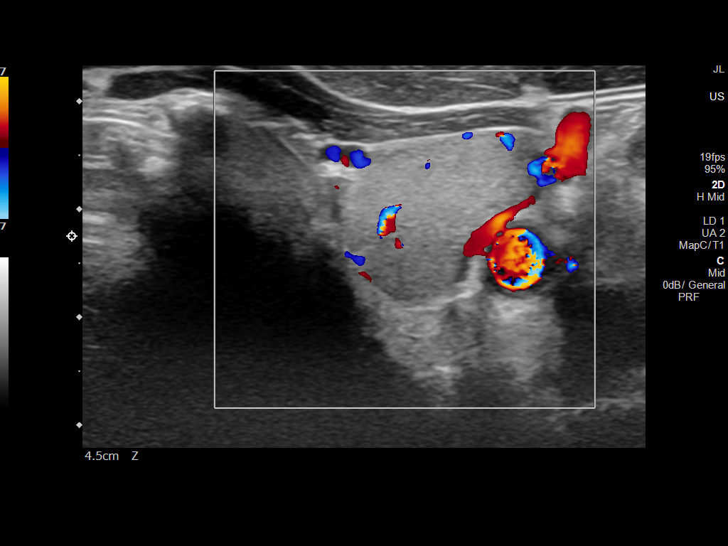
[im 35/41]
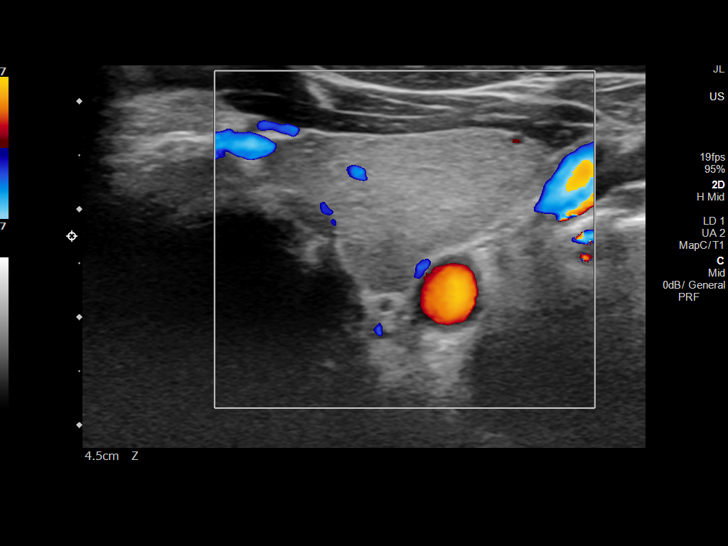
[im 39/41]
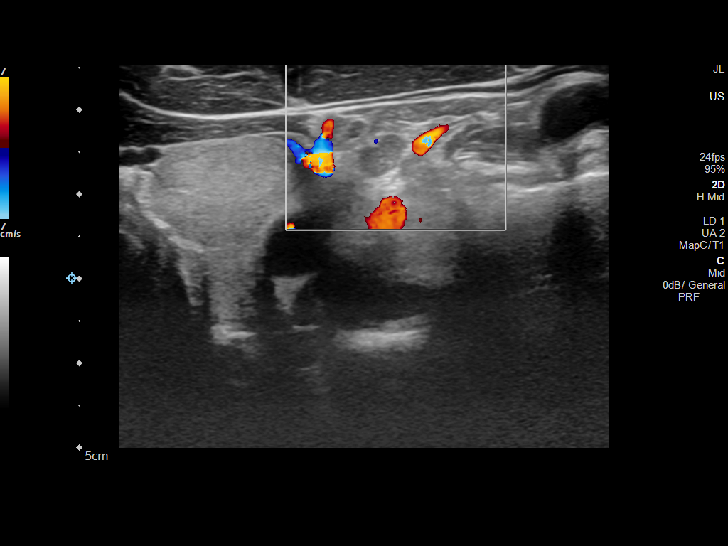

[Series 2: us thyroid · 1 of 1 slices shown (2 of 2)]
[im 1/1]
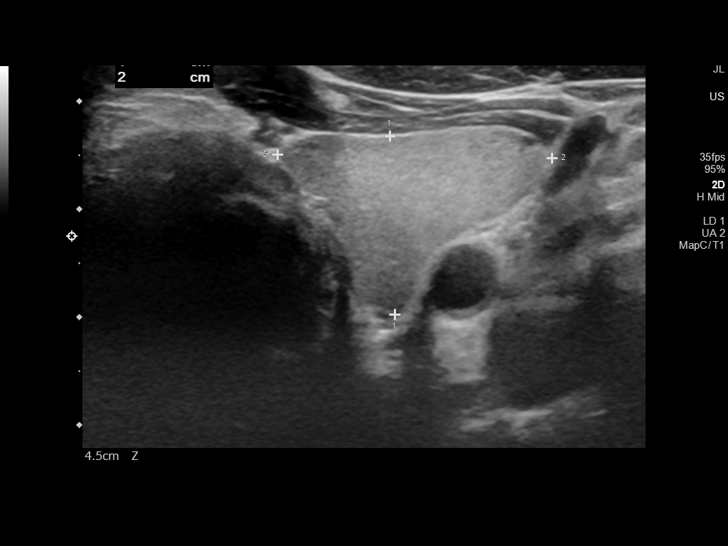

[13 of 25 positions shown; findings below may reference images not displayed]

FINDINGS: Parenchymal Echotexture: Normal

Isthmus: Normal in size measuring 0.9 cm in diameter

Right lobe: Borderline enlarged measuring 5.9 x 2.3 x 2.5 cm

Left lobe: Normal in size measuring 5.6 x 1.7 x 2.5 cm

_________________________________________________________

Estimated total number of nodules >/= 1 cm: 1

Number of spongiform nodules >/=  2 cm not described below (TR1): 0

Number of mixed cystic and solid nodules >/= 1.5 cm not described
below (TR2): 0

_________________________________________________________

Nodule # 1:

Location: Right; Mid

Maximum size: 1.6 cm; Other 2 dimensions: 1.1 x 1.0 cm

Composition: solid/almost completely solid (2)

Echogenicity: isoechoic (1)

Shape: not taller-than-wide (0)

Margins: lobulated/irregular (2)

Echogenic foci: none (0)

ACR TI-RADS total points: 5.

ACR TI-RADS risk category: TR4 (4-6 points).

ACR TI-RADS recommendations:

**Given size (>/= 1.5 cm) and appearance, fine needle aspiration of
this moderately suspicious nodule should be considered based on
TI-RADS criteria.

_________________________________________________________
IMPRESSION: Solitary right-sided thyroid nodule meets imaging criteria to
recommend percutaneous sampling as indicated.

The above is in keeping with the ACR TI-RADS recommendations - [HOSPITAL] 7676;[DATE].
